# Patient Record
Sex: Female | Born: 1975 | Race: White | Hispanic: No | Marital: Single | Smoking: Former smoker
Health system: Southern US, Community
[De-identification: ages and names within clinical notes are randomized; demographics above are authoritative.]

## PROBLEM LIST (undated history)

## (undated) DIAGNOSIS — N83209 Unspecified ovarian cyst, unspecified side: Secondary | ICD-10-CM

## (undated) DIAGNOSIS — L709 Acne, unspecified: Secondary | ICD-10-CM

## (undated) DIAGNOSIS — K8020 Calculus of gallbladder without cholecystitis without obstruction: Secondary | ICD-10-CM

## (undated) DIAGNOSIS — K219 Gastro-esophageal reflux disease without esophagitis: Secondary | ICD-10-CM

## (undated) HISTORY — DX: Calculus of gallbladder without cholecystitis without obstruction: K80.20

## (undated) HISTORY — DX: Acne, unspecified: L70.9

## (undated) HISTORY — DX: Unspecified ovarian cyst, unspecified side: N83.209

## (undated) HISTORY — DX: Gastro-esophageal reflux disease without esophagitis: K21.9

---

## 1995-11-02 HISTORY — PX: APPENDECTOMY: SHX54

## 2002-04-24 ENCOUNTER — Other Ambulatory Visit: Admission: RE | Admit: 2002-04-24 | Discharge: 2002-04-24 | Payer: Self-pay | Admitting: Obstetrics & Gynecology

## 2003-04-03 ENCOUNTER — Emergency Department (HOSPITAL_COMMUNITY): Admission: EM | Admit: 2003-04-03 | Discharge: 2003-04-04 | Payer: Self-pay | Admitting: Emergency Medicine

## 2004-11-10 ENCOUNTER — Other Ambulatory Visit: Admission: RE | Admit: 2004-11-10 | Discharge: 2004-11-10 | Payer: Self-pay | Admitting: Gynecology

## 2006-02-28 ENCOUNTER — Other Ambulatory Visit: Admission: RE | Admit: 2006-02-28 | Discharge: 2006-02-28 | Payer: Self-pay | Admitting: Gynecology

## 2007-04-03 ENCOUNTER — Other Ambulatory Visit: Admission: RE | Admit: 2007-04-03 | Discharge: 2007-04-03 | Payer: Self-pay | Admitting: Gynecology

## 2008-11-01 HISTORY — PX: BREAST LUMPECTOMY: SHX2

## 2009-01-31 ENCOUNTER — Encounter: Admission: RE | Admit: 2009-01-31 | Discharge: 2009-01-31 | Payer: Self-pay | Admitting: Gynecology

## 2009-03-05 ENCOUNTER — Ambulatory Visit (HOSPITAL_BASED_OUTPATIENT_CLINIC_OR_DEPARTMENT_OTHER): Admission: RE | Admit: 2009-03-05 | Discharge: 2009-03-05 | Payer: Self-pay | Admitting: Surgery

## 2009-03-05 ENCOUNTER — Encounter (INDEPENDENT_AMBULATORY_CARE_PROVIDER_SITE_OTHER): Payer: Self-pay | Admitting: Surgery

## 2010-07-15 ENCOUNTER — Encounter: Admission: RE | Admit: 2010-07-15 | Discharge: 2010-07-15 | Payer: Self-pay | Admitting: Gynecology

## 2011-02-09 LAB — POCT HEMOGLOBIN-HEMACUE: Hemoglobin: 11.9 g/dL — ABNORMAL LOW (ref 12.0–15.0)

## 2011-03-16 NOTE — Op Note (Signed)
[name], Kristin Mccarthy            ACCOUNT NO.:  [id number]   MEDICAL RECORD NO.:  [id number]          PATIENT TYPE:  AMB   LOCATION:  [facility]SC                          FACILITY:  [facility]   PHYSICIAN:  Currie Paris, M.D.DATE OF BIRTH:  29-Jan-1976   DATE OF PROCEDURE:  03/05/2009  DATE OF DISCHARGE:                               OPERATIVE REPORT   PREOPERATIVE DIAGNOSIS:  Right breast mass, upper outer quadrant,  clinically fibrocystic but symptomatic.   POSTOPERATIVE DIAGNOSIS:  Right breast mass, upper outer quadrant,  clinically fibrocystic but symptomatic.   OPERATION:  Excision of right breast mass.   SURGEON:  Currie Paris, MD   ANESTHESIA:  General.   CLINICAL HISTORY:  This is a 35 year old lady with a tender nodular area  in the upper outer quadrant of the right breast and wished to have this  excised both for diagnosis and for pain relief.   DESCRIPTION OF PROCEDURE:  The patient was seen in the holding area and  had no further questions.  We marked the right side as the operative  side and she was taken to the operating room.  Prior to being given any  IV sedation, the exact area was marked with the patient's cooperation in  the upper outer quadrant.   The patient then underwent satisfactory general anesthesia.  The right  breast was prepped and draped.  A time-out was done.   I made a curvilinear incision over the area in question and divided some  of the subcutaneous tissues.  I put a Vicryl suture through the area in  question which I felt to have a somewhat demarcated edge superiorly.  Using retraction, I took a wide excisional biopsy of this area.  It  appeared to be all dense fibrotic breast tissue.  Once this area was  out, I carefully palpated the remaining breast to be sure there were no  other abnormalities and everything appeared to be okay.  I then put 20  mL of 0.25% plain Marcaine in to help with postop analgesia.  The  incision was closed in  layers with 3-0 Vicryl, 4-0 Monocryl subcuticular  plus Dermabond.   The patient tolerated the procedure well and there were no operative  complications.  All counts were correct.      Currie Paris, M.D.  Electronically Signed     CJS/MEDQ  D:  03/05/2009  T:  03/06/2009  Job:  [id number]   cc:   Gretta Cool, M.D.

## 2011-11-08 ENCOUNTER — Other Ambulatory Visit: Payer: Self-pay | Admitting: Gynecology

## 2012-11-16 ENCOUNTER — Ambulatory Visit: Payer: Self-pay | Admitting: Internal Medicine

## 2012-12-11 ENCOUNTER — Other Ambulatory Visit: Payer: Self-pay | Admitting: Family

## 2013-10-12 ENCOUNTER — Ambulatory Visit: Payer: Self-pay | Admitting: Adult Health

## 2013-10-18 ENCOUNTER — Ambulatory Visit: Payer: Self-pay | Admitting: Adult Health

## 2013-10-24 ENCOUNTER — Encounter: Payer: Self-pay | Admitting: Adult Health

## 2013-10-24 ENCOUNTER — Ambulatory Visit (INDEPENDENT_AMBULATORY_CARE_PROVIDER_SITE_OTHER): Payer: Managed Care, Other (non HMO) | Admitting: Adult Health

## 2013-10-24 VITALS — BP 126/80 | HR 105 | Resp 14 | Ht 62.0 in | Wt 187.0 lb

## 2013-10-24 DIAGNOSIS — K219 Gastro-esophageal reflux disease without esophagitis: Secondary | ICD-10-CM | POA: Insufficient documentation

## 2013-10-24 DIAGNOSIS — L659 Nonscarring hair loss, unspecified: Secondary | ICD-10-CM | POA: Insufficient documentation

## 2013-10-24 MED ORDER — ESOMEPRAZOLE MAGNESIUM 40 MG PO CPDR: 40.0000 mg | DELAYED_RELEASE_CAPSULE | Freq: Every day | ORAL | Status: DC

## 2013-10-24 NOTE — Patient Instructions (Signed)
  Below is the activation code for your MyChart Account  The office will call you with an appt for Endocrinology

## 2013-10-24 NOTE — Progress Notes (Signed)
Subjective:    Patient ID: Kristin Mccarthy, female    DOB: Jul 14, 1976, 37 y.o.   MRN: [id number]  HPI  Patient is a pleasant 37 year old female who presents to clinic to establish care. She was recently followed up by Dr. Randa Lynn at [place] [facility]. She is also followed by GYN at [facility]. Patient has been experiencing some hair thinning for approximately one year. She reports history of ovarian cysts. Patient had been started on birth control medication to help regulate this. She does report improvement of the ovarian cysts; however, hair thinning has worsened since August. She recently had labs drawn and it she has brought these results. CBC is normal. Thyroid function shows normal TSH, T4 is 15.2 and T3 is 20. Suspect that the alteration in her thyroid panel may be due in part by birth control medication. Patient also reports that she has increased phlegm production mainly in the morning. She reports history of GERD. Patient has been eating very late at night and then going to sleep.    Past Medical History  Diagnosis Date  . Ovarian cyst   . Gallstones   . GERD (gastroesophageal reflux disease)   . Acne     Followed by Vaughan Sine     Past Surgical History  Procedure Laterality Date  . Appendectomy  1997  . Breast lumpectomy Right 2010    Benign     Family History  Problem Relation Age of Onset  . Hypertension Mother   . Hyperlipidemia Father   . Hypertension Father   . Diabetes Father     Prediabetes  . Heart disease Father   . Heart disease Paternal Uncle   . Heart disease Paternal Grandmother   . Heart disease Paternal Grandfather      History   Social History  . Marital Status: Single    Spouse Name: N/A    Number of Children: 0  . Years of Education: 51   Occupational History  . IT     [employer]   Social History Main Topics  . Smoking status: Former Smoker -- 1.00 packs/day    Quit date: 11/24/2008  . Smokeless tobacco: Not on file  .  Alcohol Use: Yes  . Drug Use: Not on file  . Sexual Activity: Not on file   Other Topics Concern  . Not on file   Social History Narrative   [name] grew up in [place]. She attended [employer] and obtained her Associates degree. 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 She lives at home with her two dogs. She enjoys Financial risk analyst Public librarian). She does Primary school teacher.     Review of Systems  Constitutional: Negative.   HENT: Negative.   Eyes: Negative.   Respiratory: Negative.   Cardiovascular: Negative.   Gastrointestinal: Negative for nausea, diarrhea and blood in stool.       Increased symptoms of GERD  Endocrine: Negative.   Genitourinary: Negative.   Musculoskeletal: Negative.   Skin:       Thinning hair  Allergic/Immunologic: Negative.   Neurological: Negative.   Hematological: Negative.   Psychiatric/Behavioral: Negative.        Objective:   Physical Exam  Constitutional: She is oriented to person, place, and time.  Overweight, pleasant female in no apparent distress  HENT:  Head: Normocephalic and atraumatic.  Right Ear: External ear normal.  Left  Ear: External ear normal.  Eyes: Conjunctivae and EOM are normal. Pupils are equal, round, and reactive to light.  Neck: Normal range of motion. Neck supple. No thyromegaly present.  Cardiovascular: Normal rate, regular rhythm and normal heart sounds.   Pulmonary/Chest: Effort normal and breath sounds normal. No respiratory distress. She has no wheezes. She has no rales.  Abdominal: Soft. Bowel sounds are normal.  Musculoskeletal: Normal range of motion.  Lymphadenopathy:    She has no cervical adenopathy.  Neurological: She is alert and oriented to person, place, and time. She has normal reflexes.  Skin:  Thinning hair especially on the crown  Psychiatric: She has a  normal mood and affect. Her behavior is normal. Judgment and thought content normal.          Assessment & Plan:

## 2013-10-24 NOTE — Assessment & Plan Note (Signed)
Having increasing secretion mainly in the morning. Eats late at night and goes to bed afterward. Encouraged to watch diet especially eating late at night. Try short course of Nexium. Samples provided.

## 2013-10-24 NOTE — Assessment & Plan Note (Signed)
Worsening symptoms since August. First started ~ 1 year ago. Also has hx of ovarian cyst. She has been taking birth control medication to help control symptoms. TSH normal, T4 elevated, T3 low - ?bc causing fluctuation in levels. Discussed referral to endocrinology. Pt agreeable.

## 2013-10-29 ENCOUNTER — Encounter: Payer: Self-pay | Admitting: Emergency Medicine

## 2013-11-28 ENCOUNTER — Telehealth: Payer: Self-pay | Admitting: *Deleted

## 2013-11-28 NOTE — Telephone Encounter (Signed)
Pt called, her endocrinologist appointment has been pushed out until April. Continues to have issues with hair thinning, acne. Had recent labs ordered by psychiatrist, thyroid testing was normal, Vitamin D was low, but nothing to explain her symptoms. Pt will have labs sent to office for Raquel to review. She does not feel comfortable waiting until April to see Dr. Elvera LennoxGherghe, wants to know what she should do next. Offered appt, wants Raquel's recommendation after labs are received.

## 2013-11-28 NOTE — Telephone Encounter (Signed)
Labs to Raquel to review

## 2013-11-29 ENCOUNTER — Telehealth: Payer: Self-pay | Admitting: Adult Health

## 2013-11-29 NOTE — Telephone Encounter (Signed)
Pt left VM, checking if labs were received and anxious about what she need to do next. Called back and left patient message that labs were received, Raquel will review them, but that she may call and schedule an appt with her to discuss her ongoing symptoms and what needs to be done next.

## 2013-11-29 NOTE — Telephone Encounter (Signed)
Respond to pt MyChart message

## 2013-11-30 ENCOUNTER — Telehealth: Payer: Self-pay | Admitting: Family Medicine

## 2013-11-30 NOTE — Telephone Encounter (Signed)
Called pt and was able to get pt into office in [place] on [address], Feb 5th at 4:15. Be advised.

## 2013-11-30 NOTE — Telephone Encounter (Signed)
Pt left vm saying she had appt with Dr. Elvera LennoxGherghe that she had to wait 2 months to get and it has now been rescheduled for 6 weeks out.  She is losing her hair and is anxious to be seen sooner or by another Endocrinologist.  See telephone note from 11/28/13.

## 2013-12-02 ENCOUNTER — Other Ambulatory Visit: Payer: Self-pay | Admitting: Adult Health

## 2013-12-02 MED ORDER — VITAMIN B-12 1000 MCG PO TABS: 1000.0000 ug | ORAL_TABLET | Freq: Every day | ORAL | Status: DC

## 2013-12-02 MED ORDER — ERGOCALCIFEROL 1.25 MG (50000 UT) PO CAPS: 50000.0000 [IU] | ORAL_CAPSULE | ORAL | Status: DC

## 2013-12-03 ENCOUNTER — Ambulatory Visit: Payer: Managed Care, Other (non HMO) | Admitting: Adult Health

## 2013-12-05 NOTE — Telephone Encounter (Signed)
Pt called back, has appt with Dr. Elvera LennoxGherghe tomorrow.

## 2013-12-06 ENCOUNTER — Encounter: Payer: Self-pay | Admitting: Internal Medicine

## 2013-12-06 ENCOUNTER — Other Ambulatory Visit: Payer: Managed Care, Other (non HMO) | Admitting: *Deleted

## 2013-12-06 ENCOUNTER — Ambulatory Visit (INDEPENDENT_AMBULATORY_CARE_PROVIDER_SITE_OTHER): Payer: Managed Care, Other (non HMO) | Admitting: Internal Medicine

## 2013-12-06 VITALS — BP 126/78 | HR 99 | Temp 98.6°F | Resp 12 | Ht 62.0 in | Wt 196.0 lb

## 2013-12-06 DIAGNOSIS — E538 Deficiency of other specified B group vitamins: Secondary | ICD-10-CM

## 2013-12-06 DIAGNOSIS — L659 Nonscarring hair loss, unspecified: Secondary | ICD-10-CM | POA: Insufficient documentation

## 2013-12-06 DIAGNOSIS — R946 Abnormal results of thyroid function studies: Secondary | ICD-10-CM

## 2013-12-06 MED ORDER — CYANOCOBALAMIN 1000 MCG/ML IJ SOLN
1000.0000 ug | Freq: Once | INTRAMUSCULAR | Status: AC
  Administered 2013-12-06: 1000 ug via INTRAMUSCULAR

## 2013-12-06 NOTE — Progress Notes (Signed)
Patient ID: Kristin Mccarthy, female   DOB: 08/01/1976, 38 y.o.   MRN: [id number]   HPI  Kristin Mccarthy is a 38 y.o.-year-old female, referred by her PCP, Rey,Raquel, NP, at pt's request, for evaluation for hypothyroidism and hair loss.  I reviewed pt's thyroid tests: TSH 2.010, with slightly high T4 and slightly low T3 uptake. Previous labs have also been normal.  She is concerned for hair loss. She started to have hair falling last summer >> worsened 07/2013. She saw dermatology in 09/2013  (also had acne at the same time, which resolved with treatment) >> started Rogaine (5% Minoxidil) - 2x a day >> then 1x a day >> then stopped >> now back on it.  She has been on OCPs for a long time. She gets severe sxs from ovarian cysts if she does not take them.   Labs from 2 weeks ago: Low vitamin D 14 >> started 50,000 IU weekly, and will continue with over-the-counter vitamin D afterwards Low vitamin B12 232 >> started 1000 mcg. She was recommended to start the injections, but she refused at that time because she thought that she might need to miss work to get them.  ROS: Constitutional: + weight gain, no fatigue, no subjective hyperthermia/hypothermia Eyes: no blurry vision, no xerophthalmia ENT: no sore throat, no nodules palpated in throat, no dysphagia/odynophagia, no hoarseness Cardiovascular: no CP/SOB/palpitations/leg swelling Respiratory: no cough/SOB Gastrointestinal: no N/V/D/C, + heartburn Musculoskeletal: no muscle/joint aches Skin: no rashes - also: see HPI Neurological: no tremors/numbness/tingling/dizziness Psychiatric: + both: depression/anxiety  Past Medical History  Diagnosis Date  . Ovarian cyst   . Gallstones   . GERD (gastroesophageal reflux disease)   . Acne     Followed by Vaughan Sineerm   Past Surgical History  Procedure Laterality Date  . Appendectomy  1997  . Breast lumpectomy Right 2010    Benign   History   Social History  . Marital Status: Single    Spouse  Name: N/A    Number of Children: 0  . Years of Education: 6718   Occupational History  . IT Business analyst     [employer]   Social History Main Topics  . Smoking status: Former Smoker -- 1.00 packs/day    Quit date: 11/24/2008  . Smokeless tobacco: Not on file  . Alcohol Use: Yes - wine/liquor, 3-4 drinks once a week  . Drug Use: No   Social History Narrative   Kristin Mccarthy grew up in [place]. She attended [employer] and obtained her Associates degree. Then she attended [employer] in [place] and obtained her [employer]achel[employer] Systems then obtained a Child psychotherapistMaster in Production assistant, radionformation Systems Management from [employer]. She lives at home with her two dogs. She enjoys Financial risk analysttechnology Public librarian(tech junky). She does Primary school teachergraphic design.    Current Outpatient Prescriptions on File Prior to Visit  Medication Sig Dispense Refill  . ALPRAZolam (XANAX) 0.25 MG tablet Take 0.25 mg by mouth at bedtime as needed for anxiety.      . cetirizine (ZYRTEC) 1 MG/ML syrup Take by mouth daily.      . Dapsone (ACZONE) 5 % topical gel Apply topically 2 (two) times daily.      Marland Kitchen. dextroamphetamine (DEXEDRINE SPANSULE) 15 MG 24 hr capsule       . dextroamphetamine (DEXTROSTAT) 5 MG tablet Take 5 mg by mouth daily. Take on weekends when not take the ER tablets.      . ergocalciferol (DRISDOL) 50000 UNITS capsule Take 1  capsule (50,000 Units total) by mouth once a week. Take once weekly for 12 weeks  4 capsule  2  . esomeprazole (NEXIUM) 40 MG capsule Take 1 capsule (40 mg total) by mouth daily.  30 capsule  3  . lamoTRIgine (LAMICTAL) 150 MG tablet Take 200 mg by mouth daily.       Marland Kitchen MELATONIN ER PO Take by mouth.      . Minocycline HCl 80 MG TB24 Take by mouth.      . traZODone (DESYREL) 150 MG tablet Take by mouth at bedtime.      . vitamin B-12 (CYANOCOBALAMIN) 1000 MCG tablet Take 1 tablet (1,000 mcg total) by mouth daily.  30 tablet  0   No current facility-administered medications on file  prior to visit.   No Known Allergies Family History  Problem Relation Age of Onset  . Hypertension Mother   . Hyperlipidemia Father   . Hypertension Father   . Diabetes Father     Prediabetes  . Heart disease Father   . Heart disease Paternal Uncle   . Heart disease Paternal Grandmother   . Heart disease Paternal Grandfather    PE: BP 126/78  Pulse 99  Temp(Src) 98.6 F (37 C) (Oral)  Resp 12  Ht 5\' 2"  (1.575 m)  Wt 196 lb (88.905 kg)  BMI 35.84 kg/m2  SpO2 97% Wt Readings from Last 3 Encounters:  12/06/13 196 lb (88.905 kg)  10/24/13 187 lb (84.823 kg)   Constitutional: overweight, in NAD Eyes: PERRLA, EOMI, no exophthalmos ENT: moist mucous membranes, no thyromegaly, no cervical lymphadenopathy Cardiovascular: RRR, No MRG Respiratory: CTA B Gastrointestinal: abdomen soft, NT, ND, BS+ Musculoskeletal: no deformities, strength intact in all 4 Skin: moist, warm, no rashes; hair is thin, no alopecia areata; no scalp inflammation Neurological: no tremor with outstretched hands, DTR normal in all 4  ASSESSMENT: 1. ?Hypothyroidism  2. Hair thinning/loss  3. B12 deficiency  PLAN:  1. Patient with recent thyroid tests that are normal. She appears euthyroid. I reassured the patient that she is not hypothyroid  2. Hair thinning/loss We discussed about possible causes of alopecia:  Hypothyroidism - normal recent TFTs  Pregnancy - she is on OCPs  Menopause - unlikely   Poor diet - we discussed about the benefits of a plant-based diet  Stress - advised to try to relax more  Vitamin deficiencies - takes B12 vitamin (will give im inj today - will get the rest from PCP) and advised to start B6 vitamin supplements; is not taking excess vitamin A   Anemia - recent CBC normal  Medications - not on obvious meds that can cause hair loss - she does have PCOS, which can contribute to hair thinning and loss, but she is on oral contraceptives and the hair loss started  rather abruptly - I believe that her alopecia will improve with improving her diet and her vitamin D status - Advised to try to use scalp concealer in the meantime  3. B12 deficiency - she was recommended by PCP get IM vitamin B12 then she refused because she thought that this is given several times a week. I advised her that this is usually about once a month, and gave her the injection today (per her preference (and she will have the rest through PCP).

## 2013-12-06 NOTE — Patient Instructions (Signed)
Please continue to get B12 injections - can get them here or in Raquel Rey's office. Can use scalp concealer. Start a B complex (for the B6 vitamin content).  Your thyroid tests are normal.  Please return to our office as needed.

## 2013-12-19 ENCOUNTER — Ambulatory Visit: Payer: Managed Care, Other (non HMO) | Admitting: Internal Medicine

## 2014-01-08 ENCOUNTER — Ambulatory Visit (INDEPENDENT_AMBULATORY_CARE_PROVIDER_SITE_OTHER): Payer: Managed Care, Other (non HMO) | Admitting: *Deleted

## 2014-01-08 DIAGNOSIS — E538 Deficiency of other specified B group vitamins: Secondary | ICD-10-CM

## 2014-01-08 MED ORDER — CYANOCOBALAMIN 1000 MCG/ML IJ SOLN
1000.0000 ug | Freq: Once | INTRAMUSCULAR | Status: AC
  Administered 2014-01-08: 1000 ug via INTRAMUSCULAR

## 2014-01-24 IMAGING — US TRANSABDOMINAL ULTRASOUND OF PELVIS
1 series · 14 of 25 positions shown · non-contrast
Comparison: none

REASON FOR EXAM: severe abd pain and pelvic pain  elevated white blood
count
COMMENTS:

PROCEDURE:     US  - US PELVIS EXAM  - November 16, 2012 [DATE]
RESULT:     Comparison: None.
TECHNIQUE: Multiple grayscale and color Doppler images were obtained of the
pelvis via transabdominal ultrasound. Endovaginal ultrasound was not
performed as the patient refused.

[Series 1: transabdominal ultrasound of pelvis · 0.28mm/px · 14 of 48 slices shown]
[im 1/48]
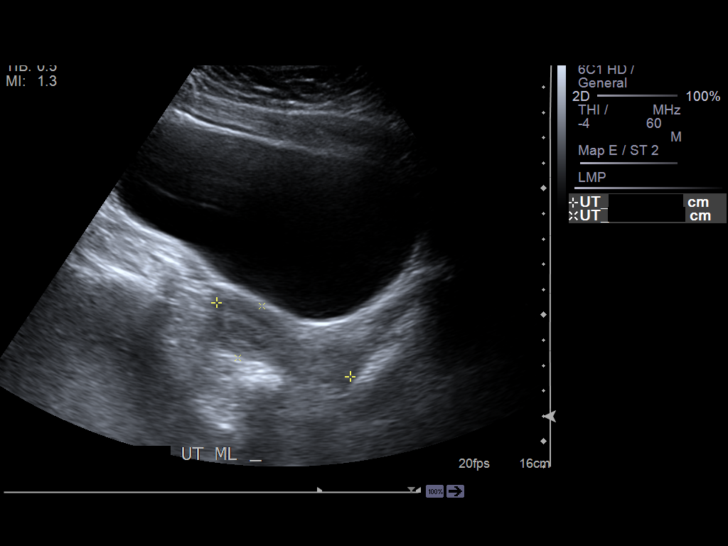
[im 4/48]
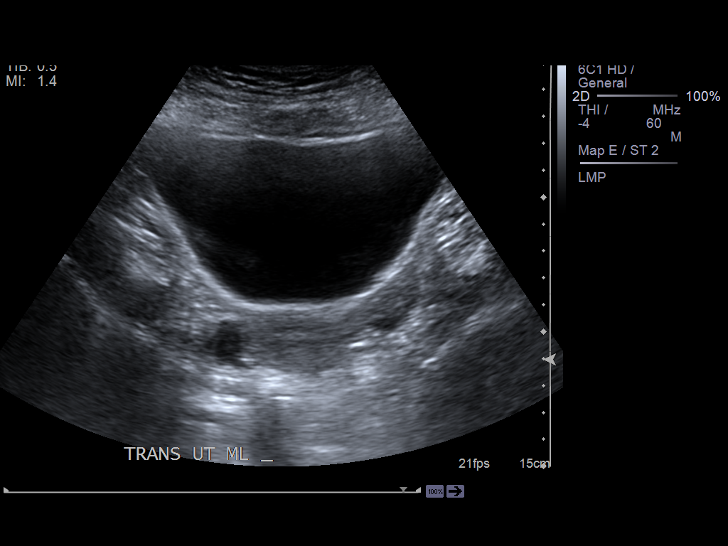
[im 8/48]
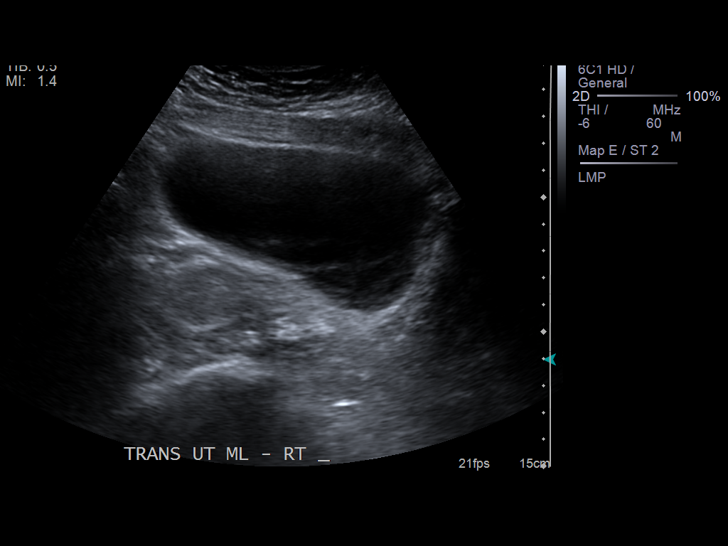
[im 12/48]
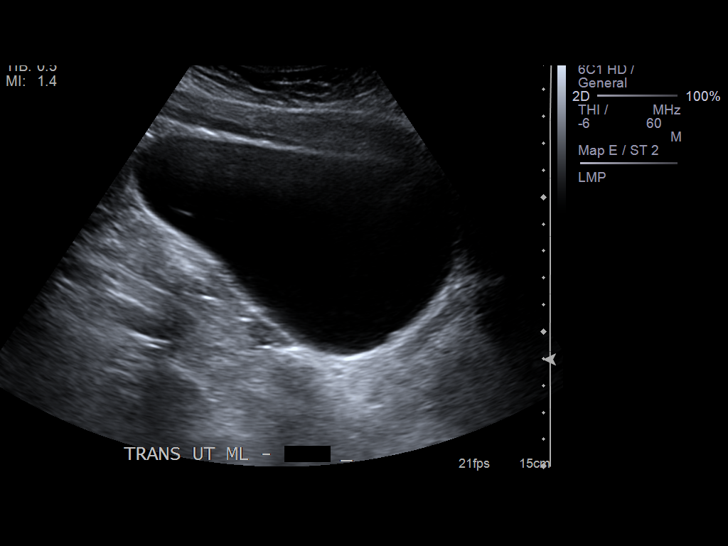
[im 16/48]
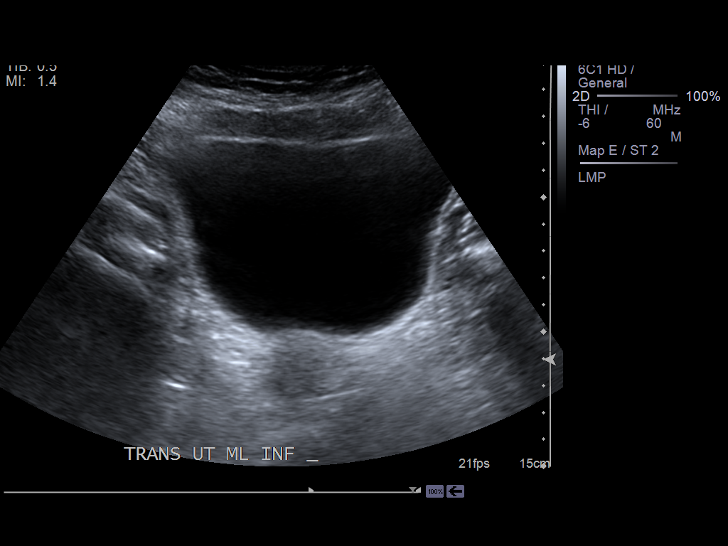
[im 18/48]
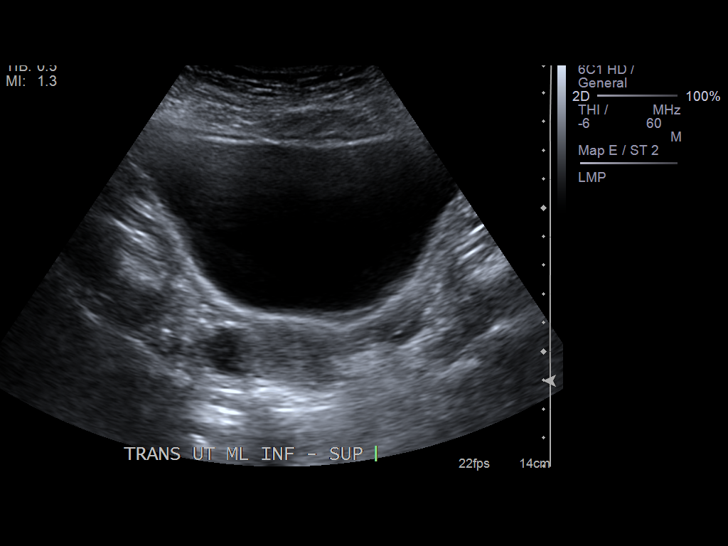
[im 22/48]
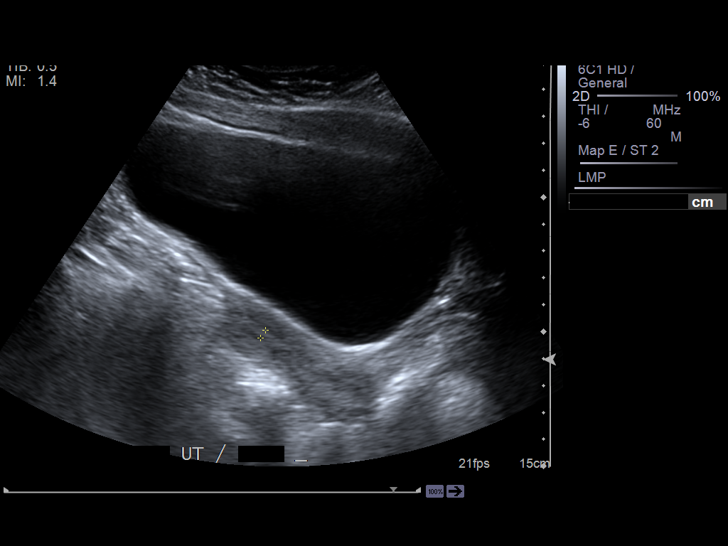
[im 26/48]
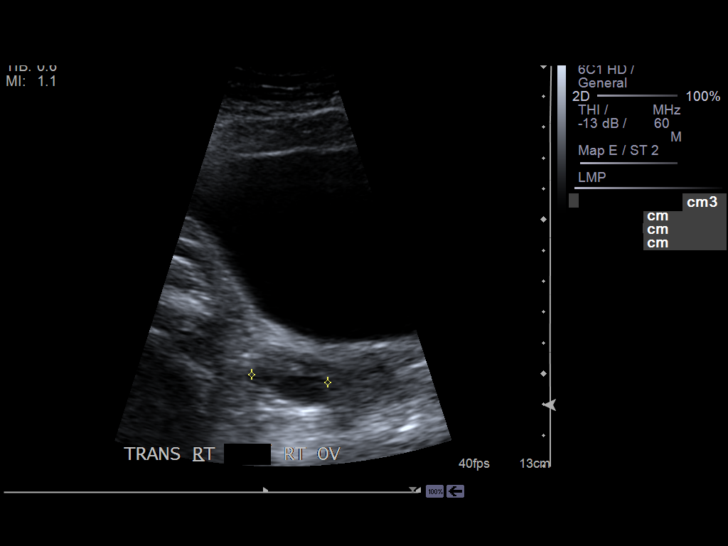
[im 30/48]
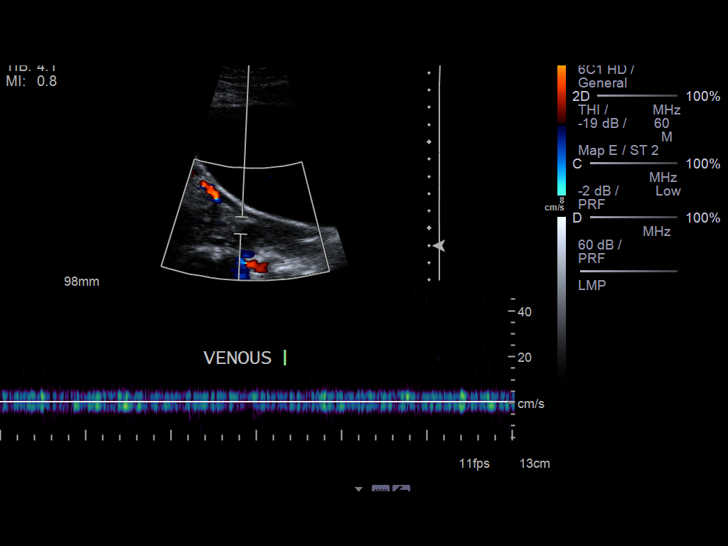
[im 32/48]
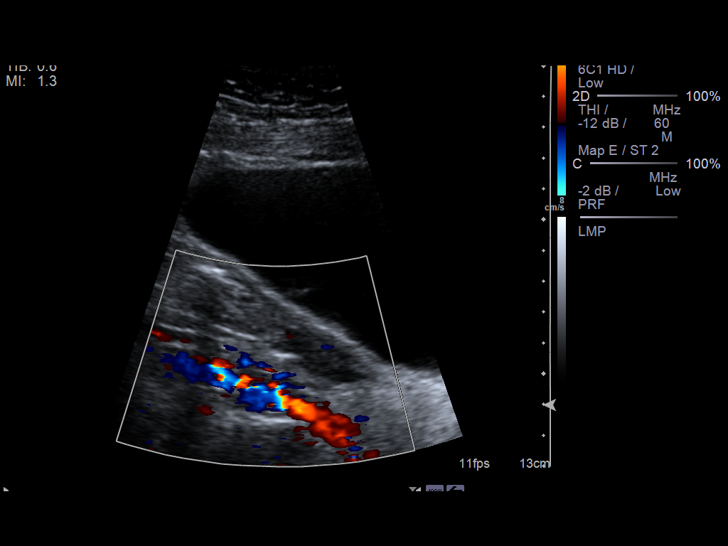
[im 36/48]
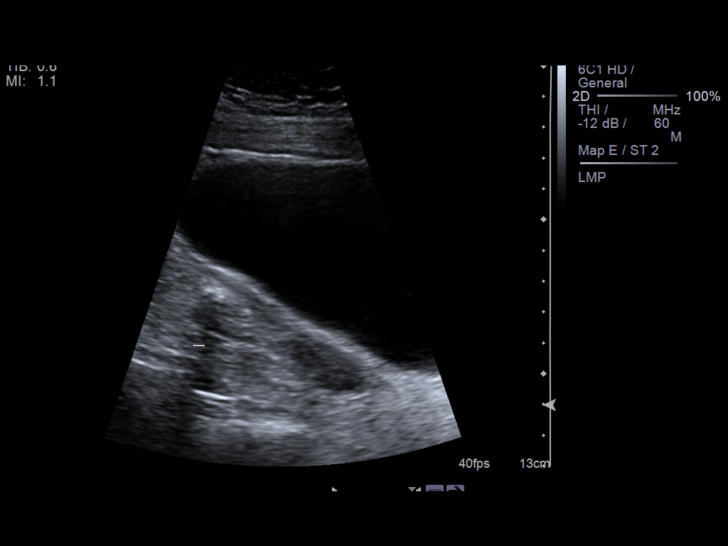
[im 40/48]
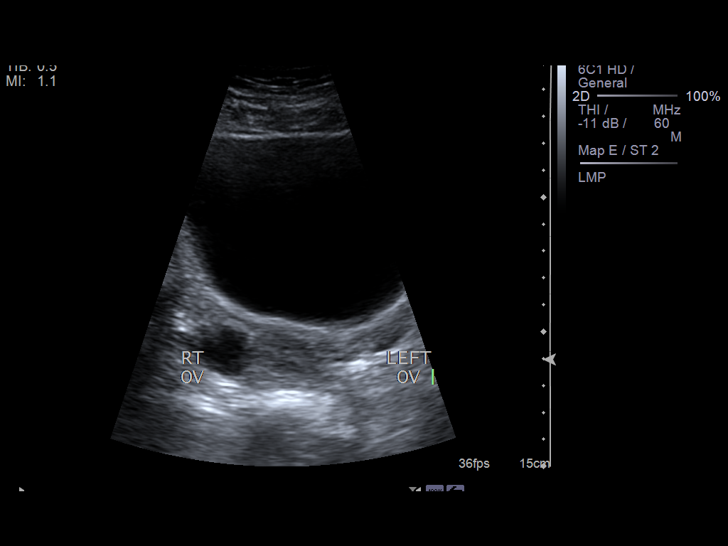
[im 44/48]
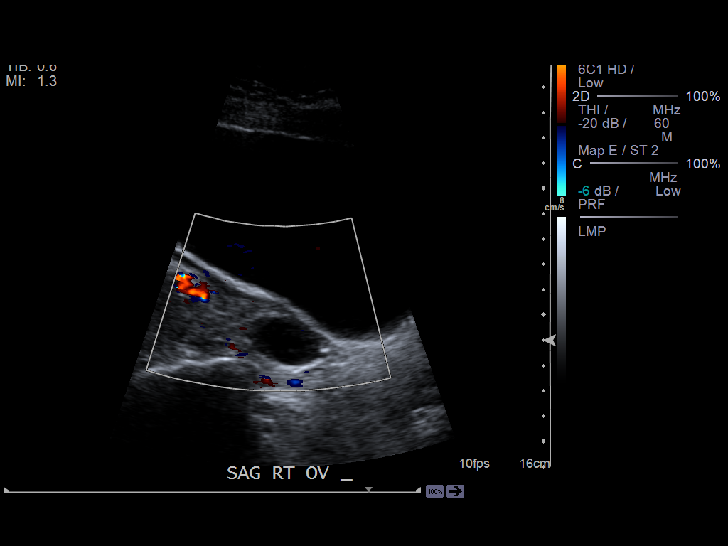
[im 48/48]
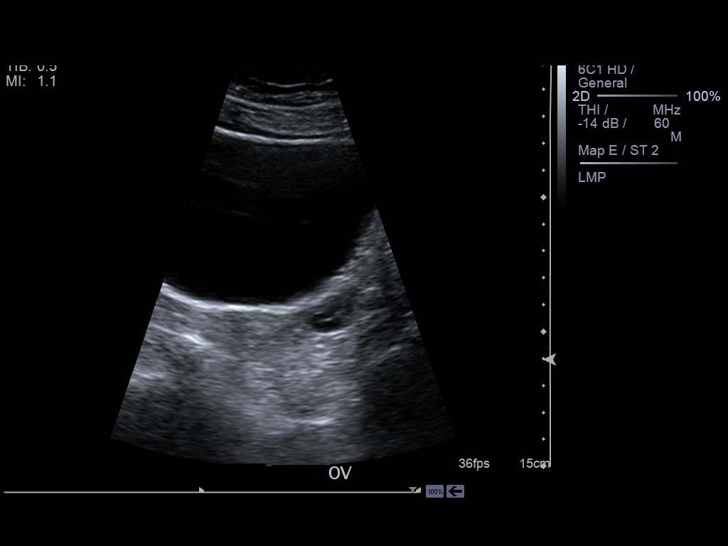

[14 of 25 positions shown; findings below may reference images not displayed]

FINDINGS: The uterus measures 6.0 x 2.3 x 3.7 cm. The endometrial stripe measures 3 mm
and thickness.

The right ovary measures 4.2 x 1.9 x 2.5 cm. The left ovary measures 2.7 x
1.3 x 2.2 cm. There is a cyst in the right ovary which measures 2.1 x 1.4 x
1.2 cm. Arterial and venous Spector Doppler waveforms are demonstrated in
the bilateral ovaries.
IMPRESSION: No acute findings. There is a 2.1 cm cyst in the right ovary.

[REDACTED]

## 2014-01-24 IMAGING — US ABDOMEN ULTRASOUND
1 series · 14 of 25 positions shown · non-contrast
Comparison: none

REASON FOR EXAM: severe abd pain and pelvic pain  elevated white blood
count
COMMENTS:

PROCEDURE:     US  - US ABDOMEN GENERAL SURVEY  - November 16, 2012 [DATE]
RESULT:     Comparison: None.
TECHNIQUE: Multiple grayscale and color Doppler images were obtained of the
abdomen.

[Series 1: abdomen ultrasound · 0.31mm/px · 14 of 99 slices shown]
[im 1/99]
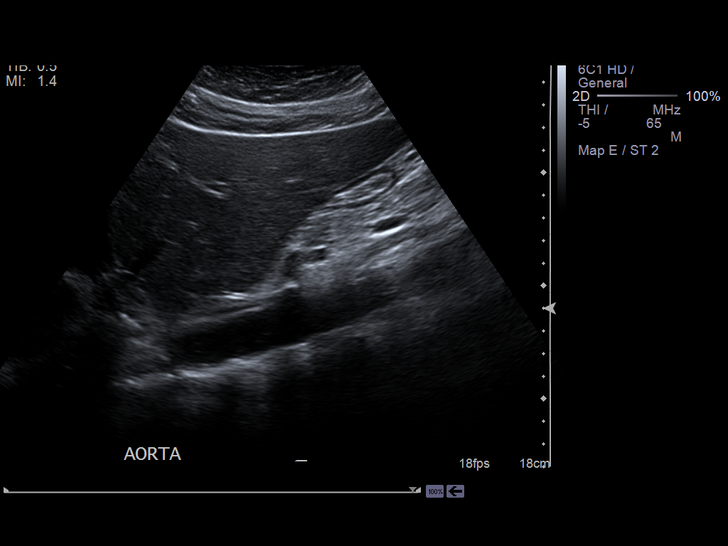
[im 9/99]
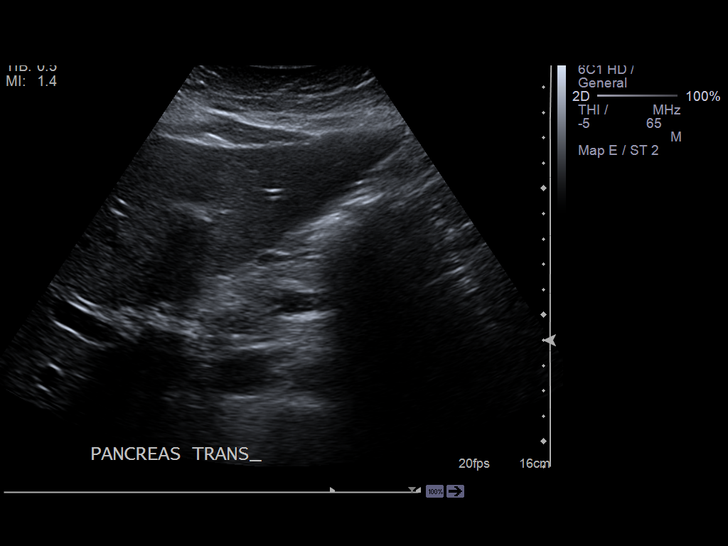
[im 17/99]
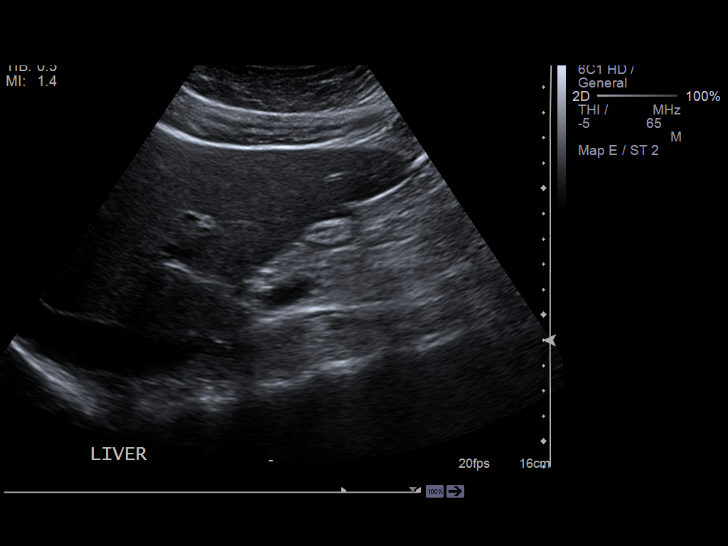
[im 25/99]
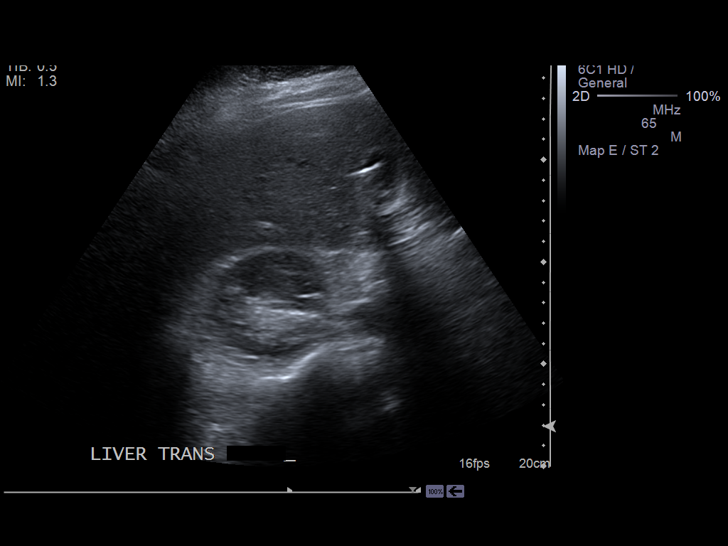
[im 33/99]
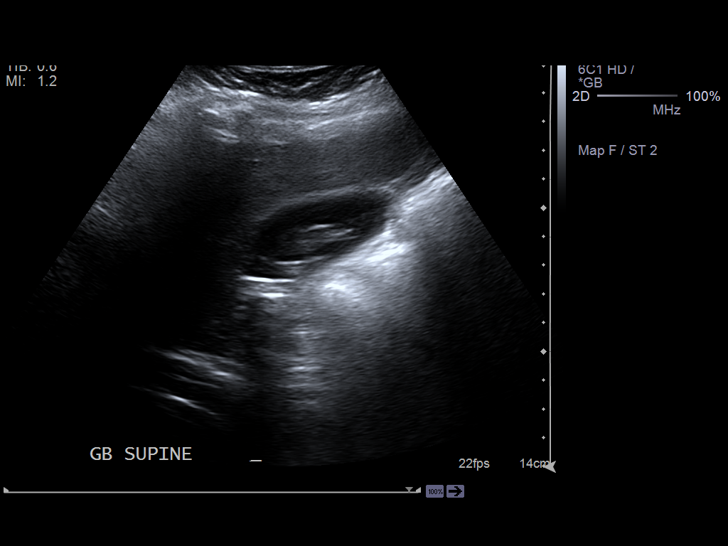
[im 37/99]
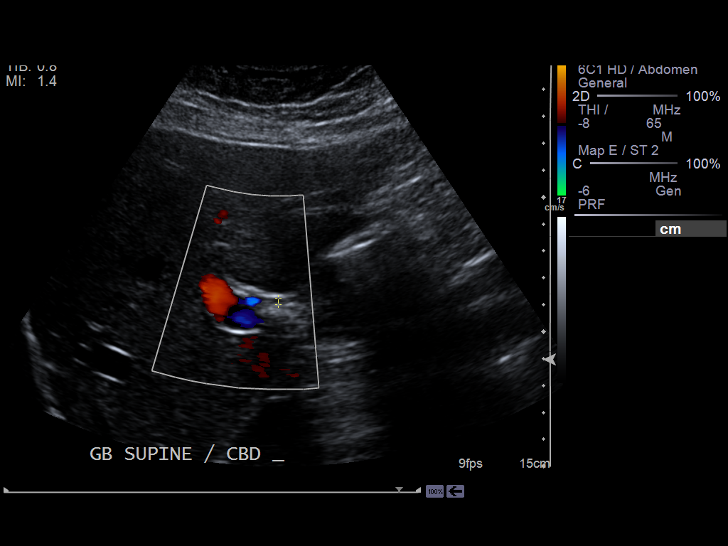
[im 45/99]
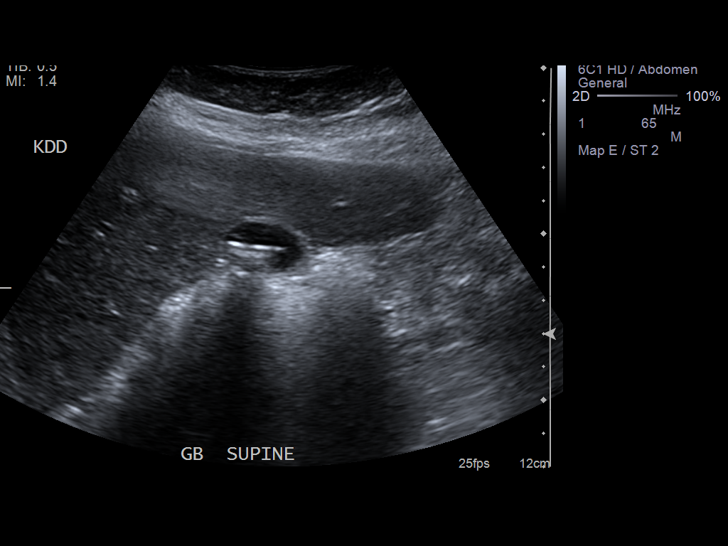
[im 54/99]
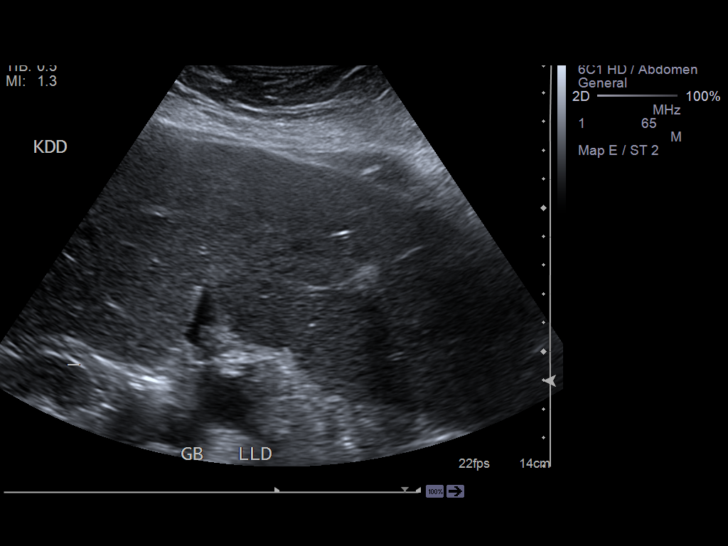
[im 62/99]
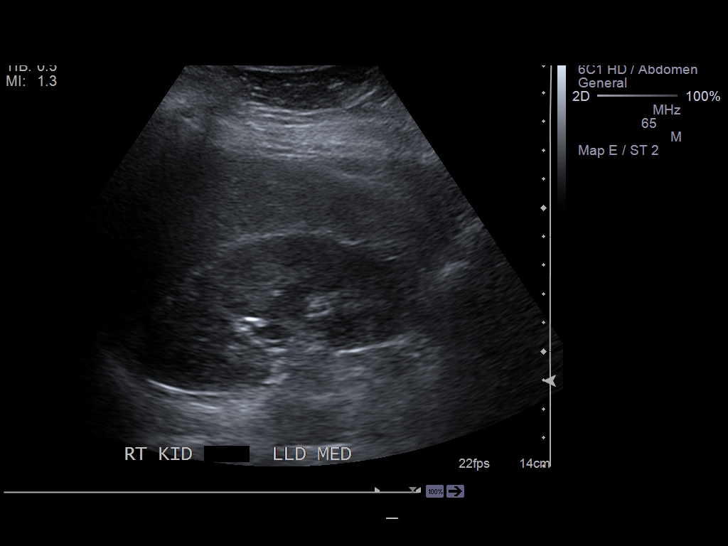
[im 66/99]
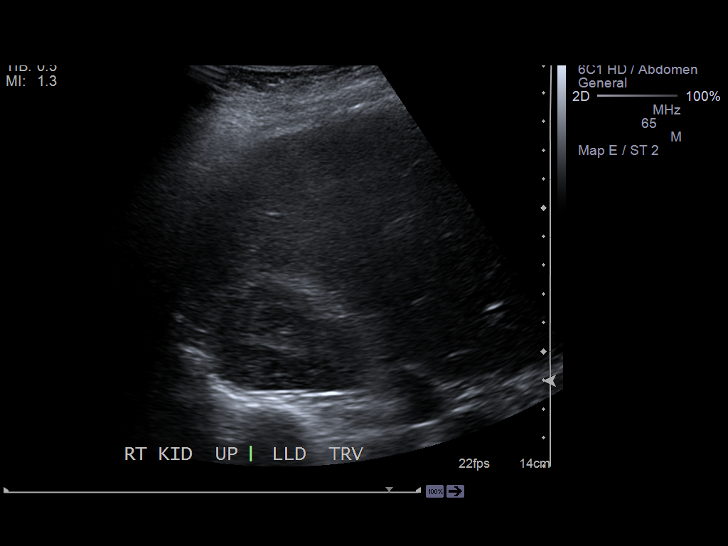
[im 74/99]
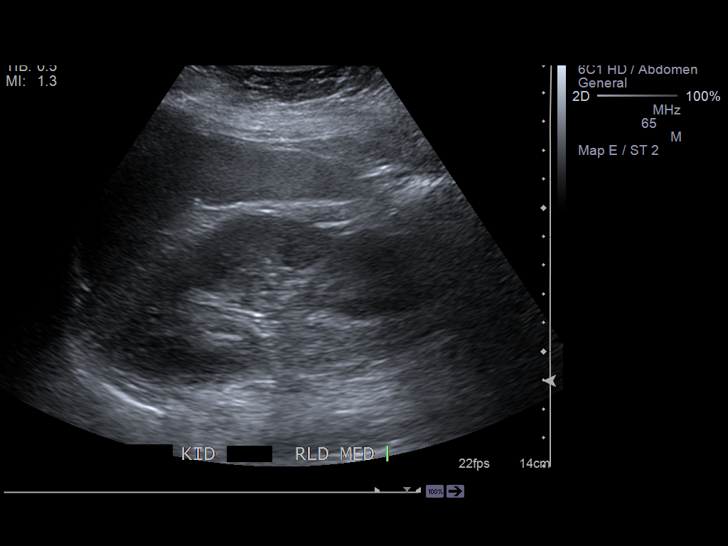
[im 82/99]
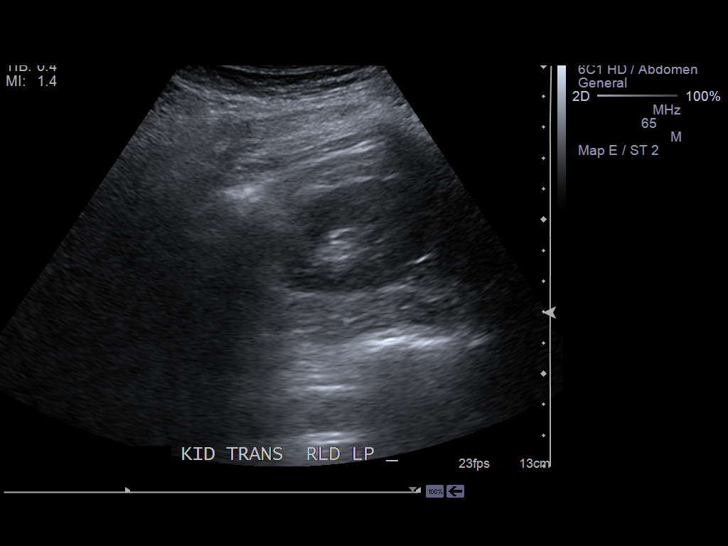
[im 90/99]
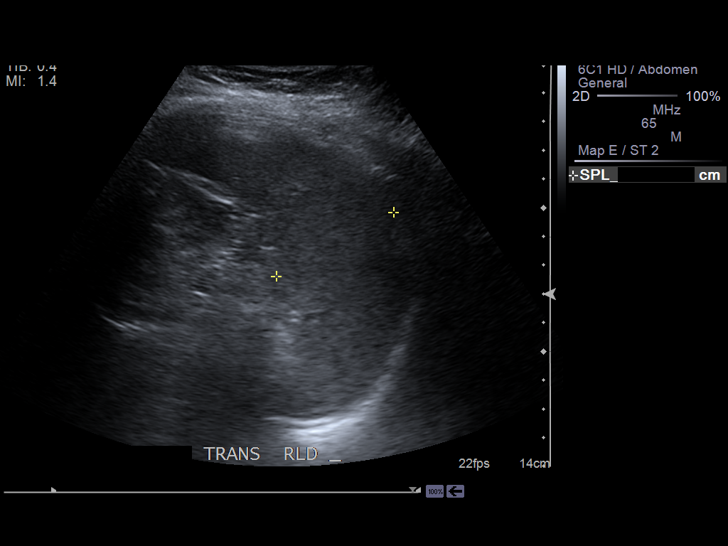
[im 99/99]
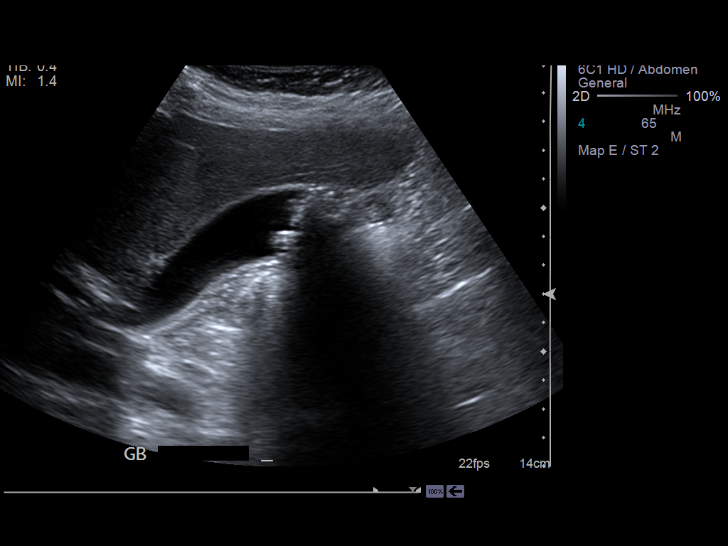

[14 of 25 positions shown; findings below may reference images not displayed]

FINDINGS: The pancreas was partially obscured by overlying bowel gas. The visualized
portion the pancreatic body is unremarkable. The visualized liver is
unremarkable. The main portal vein is patent. The common bile duct measures
2 mm in diameter. Multiple gallstones are seen within the gallbladder. No
gallbladder wall thickening or pericholecystic fluid. The sonographic Murphy
sign was negative.

Images of the kidneys showed no hydronephrosis. The visualized spleen is
unremarkable.
IMPRESSION: Cholelithiasis, without sonographic evidence of acute cholecystitis.

[REDACTED]

## 2014-02-06 ENCOUNTER — Ambulatory Visit: Payer: Managed Care, Other (non HMO) | Admitting: Internal Medicine

## 2014-02-08 ENCOUNTER — Encounter: Payer: Self-pay | Admitting: Adult Health

## 2014-02-15 ENCOUNTER — Encounter: Payer: Self-pay | Admitting: Adult Health

## 2014-02-15 ENCOUNTER — Ambulatory Visit (INDEPENDENT_AMBULATORY_CARE_PROVIDER_SITE_OTHER): Payer: Managed Care, Other (non HMO) | Admitting: Adult Health

## 2014-02-15 VITALS — BP 120/82 | HR 121 | Temp 98.4°F | Wt 205.1 lb

## 2014-02-15 DIAGNOSIS — L918 Other hypertrophic disorders of the skin: Secondary | ICD-10-CM | POA: Insufficient documentation

## 2014-02-15 DIAGNOSIS — L909 Atrophic disorder of skin, unspecified: Secondary | ICD-10-CM

## 2014-02-15 DIAGNOSIS — L919 Hypertrophic disorder of the skin, unspecified: Secondary | ICD-10-CM

## 2014-02-15 DIAGNOSIS — E538 Deficiency of other specified B group vitamins: Secondary | ICD-10-CM

## 2014-02-15 MED ORDER — ESOMEPRAZOLE MAGNESIUM 20 MG PO PACK: 20.0000 mg | PACK | Freq: Every day | ORAL | Status: DC

## 2014-02-15 MED ORDER — ALPRAZOLAM 0.25 MG PO TABS: 0.2500 mg | ORAL_TABLET | Freq: Two times a day (BID) | ORAL | Status: DC | PRN

## 2014-02-15 MED ORDER — CYANOCOBALAMIN 1000 MCG/ML IJ SOLN
1000.0000 ug | Freq: Once | INTRAMUSCULAR | Status: AC
  Administered 2014-02-15: 1000 ug via INTRAMUSCULAR

## 2014-02-15 NOTE — Progress Notes (Signed)
Pre visit review using our clinic review tool, if applicable. No additional management support is needed unless otherwise documented below in the visit note. 

## 2014-02-15 NOTE — Progress Notes (Signed)
   Subjective:    Patient ID: Kristin Mccarthy, female    DOB: May 22, 1976, 38 y.o.   MRN: [id number]  HPI  Pt is a pleasant 38 y/o female who presents to clinic to have two skin tags removed. They are on her neck and she is constantly picking at them.  Needs refill on her xanax.  Review of Systems  Skin:       Skin tags on neck  All other systems reviewed and are negative.      Objective:   Physical Exam  Constitutional: She is oriented to person, place, and time. No distress.  HENT:  Head: Normocephalic and atraumatic.  Eyes: Conjunctivae and EOM are normal.  Neck: Normal range of motion. Neck supple.  Cardiovascular: Normal rate and regular rhythm.   Pulmonary/Chest: Effort normal. No respiratory distress.  Musculoskeletal: Normal range of motion.  Neurological: She is alert and oriented to person, place, and time.  Skin: Skin is warm and dry.  Skin tags - small. Two that are side by side on the right posterior neck.  Psychiatric: She has a normal mood and affect. Her behavior is normal. Judgment and thought content normal.      Assessment & Plan:   1. Skin tag Removal of two skin tags on posterior right neck using histofreeze. Pt tolerated procedure well.

## 2014-02-15 NOTE — Addendum Note (Signed)
Addended by: Marchia MeiersEASTWOOD, Jaxie Racanelli M on: 02/15/2014 03:28 PM   Modules accepted: Orders

## 2014-02-26 ENCOUNTER — Encounter: Payer: Self-pay | Admitting: Adult Health

## 2014-03-05 ENCOUNTER — Other Ambulatory Visit: Payer: Self-pay | Admitting: Adult Health

## 2014-03-05 MED ORDER — ESOMEPRAZOLE MAGNESIUM 20 MG PO CPDR: 20.0000 mg | DELAYED_RELEASE_CAPSULE | ORAL | Status: DC

## 2014-04-01 ENCOUNTER — Telehealth: Payer: Self-pay | Admitting: *Deleted

## 2014-04-01 ENCOUNTER — Encounter: Payer: Self-pay | Admitting: Adult Health

## 2014-04-01 ENCOUNTER — Other Ambulatory Visit: Payer: Self-pay | Admitting: *Deleted

## 2014-04-01 MED ORDER — ESOMEPRAZOLE MAGNESIUM 20 MG PO CPDR: 20.0000 mg | DELAYED_RELEASE_CAPSULE | ORAL | Status: DC

## 2014-04-01 NOTE — Telephone Encounter (Signed)
Injection form was a mistake per Raquel. Pill form sent in to [facility] by [facility].

## 2014-04-01 NOTE — Telephone Encounter (Signed)
Refill Request  Esomeprazole 20 mg inj 

## 2014-04-01 NOTE — Telephone Encounter (Signed)
Refill Request  Esomeprazole 20 mg inj

## 2014-04-22 ENCOUNTER — Ambulatory Visit (INDEPENDENT_AMBULATORY_CARE_PROVIDER_SITE_OTHER): Payer: Managed Care, Other (non HMO) | Admitting: Adult Health

## 2014-04-22 ENCOUNTER — Encounter: Payer: Self-pay | Admitting: Adult Health

## 2014-04-22 VITALS — BP 114/82 | HR 97 | Temp 98.4°F | Resp 14 | Ht 62.0 in | Wt 207.8 lb

## 2014-04-22 DIAGNOSIS — L659 Nonscarring hair loss, unspecified: Secondary | ICD-10-CM

## 2014-04-22 DIAGNOSIS — L909 Atrophic disorder of skin, unspecified: Secondary | ICD-10-CM

## 2014-04-22 DIAGNOSIS — L918 Other hypertrophic disorders of the skin: Secondary | ICD-10-CM

## 2014-04-22 DIAGNOSIS — E538 Deficiency of other specified B group vitamins: Secondary | ICD-10-CM

## 2014-04-22 DIAGNOSIS — L919 Hypertrophic disorder of the skin, unspecified: Secondary | ICD-10-CM

## 2014-04-22 MED ORDER — CYANOCOBALAMIN 1000 MCG/ML IJ SOLN
1000.0000 ug | Freq: Once | INTRAMUSCULAR | Status: AC
  Administered 2014-04-22: 1000 ug via INTRAMUSCULAR

## 2014-04-22 NOTE — Progress Notes (Signed)
Pre visit review using our clinic review tool, if applicable. No additional management support is needed unless otherwise documented below in the visit note. 

## 2014-04-22 NOTE — Progress Notes (Signed)
Patient ID: Kristin CooperKimberly Mccarthy, female   DOB: May 12, 1976, 38 y.o.   MRN: [id number]   Subjective:    Patient ID: Kristin CooperKimberly Mccarthy, female    DOB: May 12, 1976, 38 y.o.   MRN: [id number]  HPI  Pt is a pleasant 38 year old female who presents to clinic with several concerns:  1. Skin tags on her neck. We have used Histofreeze on her in the past without complete resolution. She is here for repeat treatment.  2. Has missed 2 months of B12 injections. Notices a difference. She is here for her IM B12 injection.  3. Alopecia. Patient has seen dermatology and endocrine without etiology. She is concerned that her problem is ongoing and getting worse. Has spoken with her psychiatrist who, as a last resort, is willing to try to wean her off Lamictal to see if this may be the cause.   Past Medical History  Diagnosis Date  . Ovarian cyst   . Gallstones   . GERD (gastroesophageal reflux disease)   . Acne     Followed by Vaughan Sineerm    Current Outpatient Prescriptions on File Prior to Visit  Medication Sig Dispense Refill  . ALPRAZolam (XANAX) 0.25 MG tablet Take 1 tablet (0.25 mg total) by mouth 2 (two) times daily as needed for anxiety.  60 tablet  5  . cetirizine (ZYRTEC) 10 MG tablet Take 10 mg by mouth daily.      . Dapsone (ACZONE) 5 % topical gel Apply topically 2 (two) times daily.      Marland Kitchen. dextroamphetamine (DEXEDRINE SPANSULE) 15 MG 24 hr capsule       . dextroamphetamine (DEXTROSTAT) 5 MG tablet Take 5 mg by mouth daily. Take on weekends when not take the ER tablets.      . ergocalciferol (DRISDOL) 50000 UNITS capsule Take 1 capsule (50,000 Units total) by mouth once a week. Take once weekly for 12 weeks  4 capsule  2  . esomeprazole (NEXIUM) 20 MG capsule Take 1 capsule (20 mg total) by mouth every morning.  90 capsule  3  . lamoTRIgine (LAMICTAL) 100 MG tablet Take 100 mg by mouth daily.      Marland Kitchen. lamoTRIgine (LAMICTAL) 150 MG tablet Take 200 mg by mouth daily.       Devonne Doughty. LORYNA 3-0.02 MG tablet Take 1  tablet by mouth daily.       Marland Kitchen. MELATONIN ER PO Take by mouth.      . Minocycline HCl 80 MG TB24 Take 60 mg by mouth.       . Minoxidil (ROGAINE WOMENS) 5 % FOAM Apply topically.      Marland Kitchen. TAZORAC 0.1 % cream       . traZODone (DESYREL) 150 MG tablet Take by mouth at bedtime.      . vitamin B-12 (CYANOCOBALAMIN) 1000 MCG tablet Take 1 tablet (1,000 mcg total) by mouth daily.  30 tablet  0   No current facility-administered medications on file prior to visit.     Review of Systems  Constitutional: Positive for fatigue.  Skin:       Alopecia. Skin tags  All other systems reviewed and are negative.      Objective:  BP 114/82  Pulse 97  Temp(Src) 98.4 F (36.9 C) (Oral)  Resp 14  Ht 5\' 2"  (1.575 m)  Wt 207 lb 12 oz (94.235 kg)  BMI 37.99 kg/m2  SpO2 98%   Physical Exam  Constitutional: She is oriented to person, place, and time. No distress.  Cardiovascular:  Normal rate and regular rhythm.   Pulmonary/Chest: Effort normal. No respiratory distress.  Musculoskeletal: Normal range of motion.  Neurological: She is alert and oriented to person, place, and time.  Skin: Skin is warm and dry.  2 skin tags on right side of neck. Alopecia.  Psychiatric: She has a normal mood and affect. Her behavior is normal. Judgment and thought content normal.      Assessment & Plan:   1. B12 deficiency Received B12 1000 mcg IM today. - cyanocobalamin ((VITAMIN B-12)) injection 1,000 mcg; Inject 1 mL (1,000 mcg total) into the muscle once.  2. Hair loss She has seen derm and endocrine without finding etiology. Exploring if whether lamictal may be the cause. She is seeing her psychiatrist to have this weaned and see if symptoms improve.  3. Skin tag Histofreeze used on 2 skin tags on right side of neck. This is treatment #2. If no improvement will have derm remove.

## 2014-08-16 ENCOUNTER — Other Ambulatory Visit: Payer: Self-pay

## 2014-09-30 ENCOUNTER — Other Ambulatory Visit: Payer: Self-pay | Admitting: *Deleted

## 2014-09-30 MED ORDER — ALPRAZOLAM 0.25 MG PO TABS: 0.2500 mg | ORAL_TABLET | Freq: Two times a day (BID) | ORAL | Status: DC | PRN

## 2014-09-30 NOTE — Telephone Encounter (Signed)
Called to pharmacy, sent mychart on need for appt.

## 2014-09-30 NOTE — Telephone Encounter (Signed)
Approved for 1 month only # 60. Needs to make appointment to be seen for the next refill. Okay to call in. Thanks, CD.

## 2014-09-30 NOTE — Telephone Encounter (Signed)
Ok refill, last visit 04/22/14

## 2014-10-23 ENCOUNTER — Ambulatory Visit (INDEPENDENT_AMBULATORY_CARE_PROVIDER_SITE_OTHER): Payer: Managed Care, Other (non HMO) | Admitting: Nurse Practitioner

## 2014-10-23 ENCOUNTER — Encounter: Payer: Self-pay | Admitting: Nurse Practitioner

## 2014-10-23 VITALS — BP 118/65 | HR 82 | Temp 98.0°F | Resp 12 | Ht 62.0 in | Wt 187.8 lb

## 2014-10-23 DIAGNOSIS — E559 Vitamin D deficiency, unspecified: Secondary | ICD-10-CM

## 2014-10-23 DIAGNOSIS — K219 Gastro-esophageal reflux disease without esophagitis: Secondary | ICD-10-CM

## 2014-10-23 DIAGNOSIS — Z0000 Encounter for general adult medical examination without abnormal findings: Secondary | ICD-10-CM

## 2014-10-23 DIAGNOSIS — E538 Deficiency of other specified B group vitamins: Secondary | ICD-10-CM

## 2014-10-23 LAB — CBC WITH DIFFERENTIAL/PLATELET
BASOS PCT: 0.4 % (ref 0.0–3.0)
Basophils Absolute: 0 10*3/uL (ref 0.0–0.1)
EOS ABS: 0.3 10*3/uL (ref 0.0–0.7)
Eosinophils Relative: 2.2 % (ref 0.0–5.0)
HEMATOCRIT: 38.7 % (ref 36.0–46.0)
HEMOGLOBIN: 12.8 g/dL (ref 12.0–15.0)
Lymphocytes Relative: 36.2 % (ref 12.0–46.0)
Lymphs Abs: 4.2 10*3/uL — ABNORMAL HIGH (ref 0.7–4.0)
MCHC: 33.1 g/dL (ref 30.0–36.0)
MCV: 83.7 fl (ref 78.0–100.0)
MONO ABS: 0.5 10*3/uL (ref 0.1–1.0)
Monocytes Relative: 4.5 % (ref 3.0–12.0)
NEUTROS ABS: 6.5 10*3/uL (ref 1.4–7.7)
Neutrophils Relative %: 56.7 % (ref 43.0–77.0)
Platelets: 324 10*3/uL (ref 150.0–400.0)
RBC: 4.63 Mil/uL (ref 3.87–5.11)
RDW: 13.7 % (ref 11.5–15.5)
WBC: 11.5 10*3/uL — ABNORMAL HIGH (ref 4.0–10.5)

## 2014-10-23 MED ORDER — CYANOCOBALAMIN 1000 MCG/ML IJ SOLN
1000.0000 ug | Freq: Once | INTRAMUSCULAR | Status: AC
  Administered 2014-10-23: 1000 ug via INTRAMUSCULAR

## 2014-10-23 NOTE — Patient Instructions (Signed)
Please visit lab before leaving today.   We send the results to your MyChart.  See you in 1 month.

## 2014-10-23 NOTE — Progress Notes (Signed)
Pre visit review using our clinic review tool, if applicable. No additional management support is needed unless otherwise documented below in the visit note. 

## 2014-10-23 NOTE — Progress Notes (Signed)
Subjective:    Patient ID: Kristin Mccarthy, female    DOB: [birth year], 38 y.o.   MRN: [id number]  HPI  Kristin Mccarthy is a 38 yo female with a CC of med refills.   1) Last B12 shot was June 2015.   [facility] we know is covered by insurance for labs and [facility] is not.   Pt has: Dermatologist Psychiatrist- Dr. 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   Review of Systems  Constitutional: Negative for fever, chills, diaphoresis, fatigue and unexpected weight change.  Eyes: Negative for visual disturbance.  Respiratory: Negative for cough, chest tightness, shortness of breath and wheezing.   Cardiovascular: Negative for chest pain, palpitations and leg swelling.  Gastrointestinal: Negative for nausea, vomiting, abdominal pain, diarrhea and constipation.  Genitourinary: Negative for dysuria.  Musculoskeletal: Negative for myalgias.  Skin: Negative for rash.  Neurological: Positive for weakness and headaches. Negative for dizziness and numbness.  Psychiatric/Behavioral: The patient is hyperactive. The patient is not nervous/anxious.    Past Medical History  Diagnosis Date  . Ovarian cyst   . Gallstones   . GERD (gastroesophageal reflux disease)   . Acne     Followed by Vaughan Sineerm    History   Social History  . Marital Status: Single    Spouse Name: N/A    Number of Children: 0  . Years of Education: 5518   Occupational History  . IT     [employer]   Social History Main Topics  . Smoking status: Former Smoker -- 1.00 packs/day    Quit date: 11/24/2008  . Smokeless tobacco: Not on file  . Alcohol Use: Yes  . Drug Use: Not on file  . Sexual Activity: Not on file   Other Topics Concern  . Not on file   Social History Narrative   Kristin Mccarthy grew up in [place]. She attended [employer] and obtained her Associates degree. Then she attended [employer] in [place] and obtained her [employer]achelors  Information Systems then obtained a Child psychotherapistMaster in Production assistant, radionformation Systems Management from [employer]. She lives at home with her two dogs. She enjoys Financial risk analysttechnology Public librarian(tech junky). She does Primary school teachergraphic design.     Past Surgical History  Procedure Laterality Date  . Appendectomy  1997  . Breast lumpectomy Right 2010    Benign    Family History  Problem Relation Age of Onset  . Hypertension Mother   . Hyperlipidemia Father   . Hypertension Father   . Diabetes Father     Prediabetes  . Heart disease Father   . Heart disease Paternal Uncle   . Heart disease Paternal Grandmother   . Heart disease Paternal Grandfather     No Known Allergies  Current Outpatient Prescriptions on File Prior to Visit  Medication Sig Dispense Refill  . ALPRAZolam (XANAX) 0.25 MG tablet Take 1 tablet (0.25 mg total) by mouth 2 (two) times daily as needed for anxiety. 60 tablet 0  . cetirizine (ZYRTEC) 10 MG tablet Take 10 mg by mouth daily.    . Dapsone (ACZONE) 5 % topical gel Apply topically 2 (two) times daily.    Marland Kitchen. esomeprazole (NEXIUM) 20 MG capsule Take 1 capsule (20 mg total) by mouth every morning. 90 capsule 3  . LORYNA 3-0.02 MG tablet Take 1 tablet by mouth daily.     Marland Kitchen. MELATONIN ER PO Take by mouth.    Marland Kitchen. TAZORAC  0.1 % cream     . traZODone (DESYREL) 150 MG tablet Take by mouth at bedtime.    . vitamin B-12 (CYANOCOBALAMIN) 1000 MCG tablet Take 1 tablet (1,000 mcg total) by mouth daily. 30 tablet 0   No current facility-administered medications on file prior to visit.        Objective:   Physical Exam  Constitutional: She is oriented to person, place, and time. She appears well-developed and well-nourished. No distress.  HENT:  Head: Normocephalic and atraumatic.  Eyes: Conjunctivae and EOM are normal. Pupils are equal, round, and reactive to light. Right eye exhibits no discharge. Left eye exhibits no discharge. No scleral icterus.  Neck: Normal range of motion. Neck supple. No thyromegaly  present.  Cardiovascular: Normal rate and regular rhythm.   Pulmonary/Chest: Effort normal and breath sounds normal.  Abdominal: Soft. Bowel sounds are normal.  Musculoskeletal: Normal range of motion.  Lymphadenopathy:    She has no cervical adenopathy.  Neurological: She is alert and oriented to person, place, and time.  Skin: Skin is warm and dry. No rash noted. She is not diaphoretic.  Psychiatric: She has a normal mood and affect. Her behavior is normal. Judgment and thought content normal.    BP 118/65 mmHg  Pulse 82  Temp(Src) 98 F (36.7 C) (Oral)  Resp 12  Ht 5\' 2"  (1.575 m)  Wt 187 lb 12.8 oz (85.186 kg)  BMI 34.34 kg/m2  SpO2 96%      Assessment & Plan:

## 2014-10-24 DIAGNOSIS — E559 Vitamin D deficiency, unspecified: Secondary | ICD-10-CM | POA: Insufficient documentation

## 2014-10-24 LAB — VITAMIN D 25 HYDROXY (VIT D DEFICIENCY, FRACTURES): VITD: 27.57 ng/mL — ABNORMAL LOW (ref 30.00–100.00)

## 2014-10-24 LAB — VITAMIN B12: Vitamin B-12: 411 pg/mL (ref 211–911)

## 2014-10-24 NOTE — Assessment & Plan Note (Signed)
Fu in 1 month. Obtain B12 and gave shot today.

## 2014-10-24 NOTE — Assessment & Plan Note (Signed)
Stable on Nexium.

## 2014-10-24 NOTE — Assessment & Plan Note (Signed)
Obtain Vitamin D level. Continue taking OTC. Fu in 1 month

## 2014-10-28 ENCOUNTER — Encounter: Payer: Self-pay | Admitting: Nurse Practitioner

## 2014-11-18 ENCOUNTER — Telehealth: Payer: Self-pay | Admitting: Nurse Practitioner

## 2014-11-18 NOTE — Telephone Encounter (Signed)
Patient Name: Kristin Mccarthy  DOB: [birth year]    Initial Comment Caller states she has had a stiff neck for a couple, unsure if she did anything to hurt it.   Nurse Assessment  Nurse: Elijah Birkaldwell, RN, Lynda Date/Time (Eastern Time): 11/18/2014 10:44:03 AM  Confirm and document reason for call. If symptomatic, describe symptoms. ---Caller states she has had a stiff neck with some strain (like a pinched nerve) for a couple weeks, unsure if she did anything to hurt it. Thinks it may be a pillow issue. It is worse in the am. Headaches her & there.  Has the patient traveled out of the country within the last 30 days? ---Not Applicable  Does the patient require triage? ---Yes  Related visit to physician within the last 2 weeks? ---No  Does the PT have any chronic conditions? (i.e. diabetes, asthma, etc.) ---Yes  List chronic conditions. ---allergies, depression - on mood stabilizers  Did the patient indicate they were pregnant? ---No     Guidelines    Guideline Title Affirmed Question Affirmed Notes  Neck Pain or Stiffness [1] MODERATE neck pain (e.g., interferes with normal activities AND [2] present > 3 days    Final Disposition User   See PCP When Office is Open (within 3 days) Elijah Birkaldwell, Charity fundraiserN, [employer]ys[employer]Lynda

## 2014-11-20 ENCOUNTER — Ambulatory Visit (INDEPENDENT_AMBULATORY_CARE_PROVIDER_SITE_OTHER): Payer: Managed Care, Other (non HMO) | Admitting: Nurse Practitioner

## 2014-11-20 ENCOUNTER — Encounter: Payer: Self-pay | Admitting: Nurse Practitioner

## 2014-11-20 VITALS — BP 118/72 | HR 100 | Temp 98.2°F | Resp 12 | Ht 62.0 in | Wt 188.1 lb

## 2014-11-20 DIAGNOSIS — M6248 Contracture of muscle, other site: Secondary | ICD-10-CM

## 2014-11-20 DIAGNOSIS — M62838 Other muscle spasm: Secondary | ICD-10-CM

## 2014-11-20 MED ORDER — CYCLOBENZAPRINE HCL 5 MG PO TABS: 5.0000 mg | ORAL_TABLET | Freq: Every day | ORAL | Status: DC

## 2014-11-20 NOTE — Progress Notes (Signed)
Subjective:    Patient ID: Kristin Mccarthy, female    DOB: 1975-12-31, 39 y.o.   MRN: [id number]  HPI  Kristin Mccarthy is a 39 yo female with a CC of stiff neck x 7 weeks.   1) Started around the holidays, about 4 weeks of intermittent and then 3 weeks solid. It is located bilaterally. 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 Treatment to date:  Tried stretching at home  Trying different pillows Tried different positions  Not worsening  Ibuprofen- somewhat helpful   Review of Systems  Constitutional: Negative for fever, chills, diaphoresis and fatigue.  Respiratory: Negative for chest tightness, shortness of breath and wheezing.   Cardiovascular: Negative for chest pain, palpitations and leg swelling.  Gastrointestinal: Negative for nausea, vomiting, diarrhea and rectal pain.  Musculoskeletal: Positive for myalgias and neck stiffness. Negative for neck pain.       Muscles are tight she states  Skin: Negative for rash.  Neurological: Negative for dizziness, weakness, numbness and headaches.  Psychiatric/Behavioral: The patient is not nervous/anxious.    Past Medical History  Diagnosis Date  . Ovarian cyst   . Gallstones   . GERD (gastroesophageal reflux disease)   . Acne     Followed by Vaughan Sine    History   Social History  . Marital Status: Single    Spouse Name: N/A    Number of Children: 0  . Years of Education: 35   Occupational History  . IT     [employer]   Social History Main Topics  . Smoking status: Former Smoker -- 1.00 packs/day    Quit date: 11/24/2008  . Smokeless tobacco: Not on file  . Alcohol Use: Yes  . Drug Use: Not on file  . Sexual Activity: Not on file   Other Topics Concern  . Not on file   Social History Narrative   Kristin Mccarthy grew up in [place]. She attended [employer] and obtained her Associates degree. 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 She lives at home with her two dogs. She enjoys Financial risk analyst Public librarian). She does Primary school teacher.     Past Surgical History  Procedure Laterality Date  . Appendectomy  1997  . Breast lumpectomy Right 2010    Benign    Family History  Problem Relation Age of Onset  . Hypertension Mother   . Hyperlipidemia Father   . Hypertension Father   . Diabetes Father     Prediabetes  . Heart disease Father   . Heart disease Paternal Uncle   . Heart disease Paternal Grandmother   . Heart disease Paternal Grandfather     No Known Allergies  Current Outpatient Prescriptions on File Prior to Visit  Medication Sig Dispense Refill  . ALPRAZolam (XANAX) 0.25 MG tablet Take 1 tablet (0.25 mg total) by mouth 2 (two) times daily as needed for anxiety. 60 tablet 0  . cetirizine (ZYRTEC) 10 MG tablet Take 10 mg by mouth daily.    . Dapsone (ACZONE) 5 % topical gel Apply topically 2 (two) times daily.    Marland Kitchen esomeprazole (NEXIUM) 20 MG capsule Take 1 capsule (20 mg total) by mouth every morning. 90 capsule 3  . LORYNA 3-0.02 MG tablet Take 1 tablet by mouth daily.     Marland Kitchen MELATONIN ER PO Take by mouth.    Marland Kitchen  TAZORAC 0.1 % cream     . traZODone (DESYREL) 150 MG tablet Take by mouth at bedtime.    . vitamin B-12 (CYANOCOBALAMIN) 1000 MCG tablet Take 1 tablet (1,000 mcg total) by mouth daily. 30 tablet 0  . VYVANSE 40 MG capsule Take 40 mg by mouth every morning.  0  . lamoTRIgine (LAMICTAL) 25 MG tablet Take 25 mg by mouth daily.  0   No current facility-administered medications on file prior to visit.       Objective:   Physical Exam  Constitutional: She is oriented to person, place, and time. She appears well-developed and well-nourished. No distress.  BP 118/72 mmHg  Pulse 100  Temp(Src) 98.2 F (36.8 C) (Oral)  Resp 12  Ht 5\' 2"  (1.575 m)  Wt 188 lb 1.9 oz (85.331 kg)  BMI 34.40  kg/m2  SpO2 98%  LMP  (Approximate)   HENT:  Head: Normocephalic and atraumatic.  Right Ear: External ear normal.  Left Ear: External ear normal.  Eyes: Conjunctivae and EOM are normal. Pupils are equal, round, and reactive to light. Right eye exhibits no discharge. Left eye exhibits no discharge. No scleral icterus.  Neck: Normal range of motion. Neck supple. No thyromegaly present.  Pt is able to do full ROM. Tender to palpation of trapezius muscles bilaterally.   Cardiovascular: Normal rate and regular rhythm.   Pulmonary/Chest: Effort normal and breath sounds normal.  Lymphadenopathy:    She has no cervical adenopathy.  Neurological: She is alert and oriented to person, place, and time. No cranial nerve deficit. She exhibits normal muscle tone. Coordination normal.  Negative tests for meningitis.   Skin: Skin is warm and dry. No rash noted. She is not diaphoretic.  Psychiatric: She has a normal mood and affect. Her behavior is normal. Judgment and thought content normal.       Assessment & Plan:

## 2014-11-20 NOTE — Patient Instructions (Signed)
Tuesday 3:00 p.m. - 5:30 p.m. Wednesday 2:00 p.m. - 5:00 p.m. Thursday 9:00 am - 2:00 p.m.  Location RN [facility] [address], [place] [address] Phone [phone]

## 2014-11-20 NOTE — Progress Notes (Signed)
Pre visit review using our clinic review tool, if applicable. No additional management support is needed unless otherwise documented below in the visit note. 

## 2014-11-22 DIAGNOSIS — M62838 Other muscle spasm: Secondary | ICD-10-CM | POA: Insufficient documentation

## 2014-11-22 NOTE — Assessment & Plan Note (Signed)
Stable. Rx for medical [facility]. Also, rx for flexeril 5 mg at bedtime. FU prn.

## 2015-01-17 ENCOUNTER — Other Ambulatory Visit: Payer: Self-pay | Admitting: *Deleted

## 2015-01-17 ENCOUNTER — Telehealth: Payer: Self-pay | Admitting: *Deleted

## 2015-01-17 MED ORDER — ALPRAZOLAM 0.25 MG PO TABS: 0.2500 mg | ORAL_TABLET | Freq: Two times a day (BID) | ORAL | Status: DC | PRN

## 2015-01-17 NOTE — Telephone Encounter (Signed)
Okay to refill? 

## 2015-01-17 NOTE — Telephone Encounter (Signed)
Fax from pharmacy requesting Alprazolam 0.25 mg.  Last refill 11.30.15.  Last OV 1.20.16.  Please advise refill

## 2015-01-17 NOTE — Telephone Encounter (Signed)
rx faxed

## 2015-02-26 ENCOUNTER — Encounter: Payer: Self-pay | Admitting: Nurse Practitioner

## 2015-03-03 ENCOUNTER — Other Ambulatory Visit: Payer: Self-pay | Admitting: *Deleted

## 2015-03-03 MED ORDER — ESOMEPRAZOLE MAGNESIUM 20 MG PO CPDR: 20.0000 mg | DELAYED_RELEASE_CAPSULE | ORAL | Status: DC

## 2015-03-04 MED ORDER — ESOMEPRAZOLE MAGNESIUM 20 MG PO CPDR: 20.0000 mg | DELAYED_RELEASE_CAPSULE | ORAL | Status: DC

## 2015-03-04 NOTE — Telephone Encounter (Signed)
Rfill accidnetally sent to [facility], resent to [facility]

## 2015-03-04 NOTE — Addendum Note (Signed)
Addended by: Chandra BatchNIXON, Damareon Lanni E on: 03/04/2015 11:03 AM   Modules accepted: Orders

## 2015-08-17 ENCOUNTER — Other Ambulatory Visit: Payer: Self-pay | Admitting: Nurse Practitioner

## 2015-08-18 ENCOUNTER — Other Ambulatory Visit: Payer: Self-pay | Admitting: *Deleted

## 2015-08-18 MED ORDER — ESOMEPRAZOLE MAGNESIUM 20 MG PO CPDR: 20.0000 mg | DELAYED_RELEASE_CAPSULE | ORAL | Status: DC

## 2015-10-10 ENCOUNTER — Encounter: Payer: Self-pay | Admitting: Nurse Practitioner

## 2015-10-14 ENCOUNTER — Other Ambulatory Visit: Payer: Self-pay | Admitting: Nurse Practitioner

## 2015-10-14 MED ORDER — SERTRALINE HCL 50 MG PO TABS: 50.0000 mg | ORAL_TABLET | Freq: Every day | ORAL | Status: DC

## 2015-11-12 ENCOUNTER — Encounter: Payer: Self-pay | Admitting: Nurse Practitioner

## 2015-11-12 ENCOUNTER — Ambulatory Visit (INDEPENDENT_AMBULATORY_CARE_PROVIDER_SITE_OTHER): Payer: 59 | Admitting: Nurse Practitioner

## 2015-11-12 VITALS — BP 104/78 | HR 107 | Temp 97.9°F | Resp 16 | Ht 62.0 in | Wt 213.6 lb

## 2015-11-12 DIAGNOSIS — E669 Obesity, unspecified: Secondary | ICD-10-CM | POA: Diagnosis not present

## 2015-11-12 DIAGNOSIS — G4700 Insomnia, unspecified: Secondary | ICD-10-CM | POA: Diagnosis not present

## 2015-11-12 DIAGNOSIS — F909 Attention-deficit hyperactivity disorder, unspecified type: Secondary | ICD-10-CM | POA: Diagnosis not present

## 2015-11-12 MED ORDER — DEXTROAMPHETAMINE SULFATE ER 15 MG PO CP24: 15.0000 mg | ORAL_CAPSULE | Freq: Every morning | ORAL | Status: DC

## 2015-11-12 NOTE — Patient Instructions (Signed)
Follow up with me in 3 months.   MyChart me if anything changes

## 2015-11-12 NOTE — Progress Notes (Signed)
Patient ID: Kristin Mccarthy, female    DOB: [birth year]  Age: 40 y.o. MRN: [id number]  CC: medication consult   HPI Kristin Mccarthy presents for CC of taking over prescribing of medications.  1) Dr. Imogene Burn has left his practice in [facility]  Getting set up with a therapist  Sertraline 50 mg  Doxepin 10 mg/ ml oral concentrate  Dextroamphetamine Sulfate 25 mg ER   Ronnette Juniper behavioral is new therapist  Concerned about weight gain Eating often, binging, gets take out often  Was on Vyvanse in past  History Leanza has a past medical history of Ovarian cyst; Gallstones; GERD (gastroesophageal reflux disease); and Acne.   She has past surgical history that includes Appendectomy (1997) and Breast lumpectomy (Right, 2010).   Her family history includes Diabetes in her father; Heart disease in her father, paternal grandfather, paternal grandmother, and paternal uncle; Hyperlipidemia in her father; Hypertension in her father and mother.She reports that she quit smoking about 6 years ago. She does not have any smokeless tobacco history on file. She reports that she drinks alcohol. Her drug history is not on file.  Outpatient Prescriptions Prior to Visit  Medication Sig Dispense Refill  . ALPRAZolam (XANAX) 0.25 MG tablet Take 1 tablet (0.25 mg total) by mouth 2 (two) times daily as needed for anxiety. 60 tablet 0  . cetirizine (ZYRTEC) 10 MG tablet Take 10 mg by mouth daily.    . Dapsone (ACZONE) 5 % topical gel Apply topically 2 (two) times daily.    Marland Kitchen MELATONIN ER PO Take by mouth.    . sertraline (ZOLOFT) 50 MG tablet Take 1 tablet (50 mg total) by mouth daily. 90 tablet 0  . TAZORAC 0.1 % cream     . traZODone (DESYREL) 150 MG tablet Take by mouth at bedtime.    Marland Kitchen VYVANSE 40 MG capsule Take 40 mg by mouth every morning.  0  . cyclobenzaprine (FLEXERIL) 5 MG tablet Take 1 tablet (5 mg total) by mouth at bedtime. (Patient not taking: Reported on 11/12/2015) 30 tablet 0  .  esomeprazole (NEXIUM) 20 MG capsule Take 1 capsule (20 mg total) by mouth every morning. (Patient not taking: Reported on 11/12/2015) 90 capsule 1  . lamoTRIgine (LAMICTAL) 25 MG tablet Take 25 mg by mouth daily. Reported on 11/12/2015  0  . LORYNA 3-0.02 MG tablet Take 1 tablet by mouth daily.     Marland Kitchen topiramate (TOPAMAX) 100 MG tablet Take 150 mg by mouth daily.    . vitamin B-12 (CYANOCOBALAMIN) 1000 MCG tablet Take 1 tablet (1,000 mcg total) by mouth daily. (Patient not taking: Reported on 11/12/2015) 30 tablet 0   No facility-administered medications prior to visit.    ROS Review of Systems  Constitutional: Positive for appetite change. Negative for fever, chills, diaphoresis, activity change, fatigue and unexpected weight change.       Appetite increased  Respiratory: Negative for chest tightness, shortness of breath and wheezing.   Cardiovascular: Negative for chest pain, palpitations and leg swelling.  Gastrointestinal: Negative for nausea, vomiting and diarrhea.  Skin: Negative for rash.  Neurological: Negative for dizziness, weakness, numbness and headaches.  Psychiatric/Behavioral: Positive for sleep disturbance and decreased concentration. Negative for suicidal ideas. The patient is nervous/anxious.     Objective:  BP 104/78 mmHg  Pulse 107  Temp(Src) 97.9 F (36.6 C) (Oral)  Resp 16  Ht 5\' 2"  (1.575 m)  Wt 213 lb 9.6 oz (96.888 kg)  BMI 39.06 kg/m2  SpO2 97%  LMP 08/12/2015   Physical Exam  Constitutional: She is oriented to person, place, and time. She appears well-developed and well-nourished. No distress.  HENT:  Head: Normocephalic and atraumatic.  Right Ear: External ear normal.  Left Ear: External ear normal.  Cardiovascular: Regular rhythm and normal heart sounds.  Exam reveals no gallop and no friction rub.   No murmur heard. Slightly tachycardic  Pulmonary/Chest: Effort normal and breath sounds normal. No respiratory distress. She has no wheezes. She has no  rales. She exhibits no tenderness.  Neurological: She is alert and oriented to person, place, and time. No cranial nerve deficit. She exhibits normal muscle tone. Coordination normal.  Skin: Skin is warm and dry. No rash noted. She is not diaphoretic.  Psychiatric: She has a normal mood and affect. Her behavior is normal. Judgment and thought content normal.      Assessment & Plan:   Kristin Mccarthy was seen today for medication consult.  Diagnoses and all orders for this visit:  Attention deficit hyperactivity disorder (ADHD), unspecified ADHD type  Insomnia  Obese  Other orders -     Discontinue: dextroamphetamine (DEXEDRINE SPANSULE) 15 MG 24 hr capsule; Take 1 capsule (15 mg total) by mouth every morning. -     Discontinue: dextroamphetamine (DEXEDRINE SPANSULE) 15 MG 24 hr capsule; Take 1 capsule (15 mg total) by mouth every morning. -     dextroamphetamine (DEXEDRINE SPANSULE) 15 MG 24 hr capsule; Take 1 capsule (15 mg total) by mouth every morning.   I have discontinued Ms. Weiss's traZODone, vitamin B-12, LORYNA, VYVANSE, lamoTRIgine, topiramate, cyclobenzaprine, and esomeprazole. I am also having her maintain her Dapsone, MELATONIN ER PO, TAZORAC, cetirizine, ALPRAZolam, sertraline, drospirenone-ethinyl estradiol, doxepin, fluticasone, and dextroamphetamine.  Meds ordered this encounter  Medications  . drospirenone-ethinyl estradiol (YAZ,GIANVI,LORYNA) 3-0.02 MG tablet    Sig: take 1 tablet by mouth daily  . DISCONTD: dextroamphetamine (DEXEDRINE SPANSULE) 15 MG 24 hr capsule    Sig: Take 15 mg by mouth every morning. Reported on 11/12/2015    Refill:  0  . doxepin (SINEQUAN) 10 MG/ML solution    Sig:     Refill:  0  . fluticasone (FLONASE) 50 MCG/ACT nasal spray    Sig: Reported on 11/12/2015    Refill:  0  . DISCONTD: dextroamphetamine (DEXEDRINE SPANSULE) 15 MG 24 hr capsule    Sig: Take 1 capsule (15 mg total) by mouth every morning.    Dispense:  30 capsule     Refill:  0    Fill on or after Jan. 11th, 2017    Order Specific Question:  Supervising Provider    Answer:  Duncan DullULLO, TERESA L [[id number]]  . DISCONTD: dextroamphetamine (DEXEDRINE SPANSULE) 15 MG 24 hr capsule    Sig: Take 1 capsule (15 mg total) by mouth every morning.    Dispense:  30 capsule    Refill:  0    Fill on or after Feb 11th, 2017    Order Specific Question:  Supervising Provider    Answer:  Duncan DullULLO, TERESA L [[id number]]  . dextroamphetamine (DEXEDRINE SPANSULE) 15 MG 24 hr capsule    Sig: Take 1 capsule (15 mg total) by mouth every morning.    Dispense:  30 capsule    Refill:  0    Fill on or after March 11th, 2017    Order Specific Question:  Supervising Provider    Answer:  Sherlene ShamsULLO, TERESA L [[id number]]     Follow-up: Return in about 3 months (around 02/10/2016)  for Follow up.

## 2015-11-16 DIAGNOSIS — G4700 Insomnia, unspecified: Secondary | ICD-10-CM | POA: Insufficient documentation

## 2015-11-16 DIAGNOSIS — F909 Attention-deficit hyperactivity disorder, unspecified type: Secondary | ICD-10-CM | POA: Insufficient documentation

## 2015-11-16 DIAGNOSIS — E669 Obesity, unspecified: Secondary | ICD-10-CM | POA: Insufficient documentation

## 2015-11-16 NOTE — Assessment & Plan Note (Signed)
New onset Melatonin not helpful Doxepin not helpful  Pt and therapist believe it is psychologically induced  Sleeps well on weekends

## 2015-11-16 NOTE — Assessment & Plan Note (Addendum)
Taking over medications for Dr. Imogene Burnhen who has moved Dexedrine spansule 3 months printed and given to pt  FU in 3 months CSC signed today

## 2015-11-16 NOTE — Assessment & Plan Note (Signed)
Established worsening Wt Readings from Last 3 Encounters:  11/12/15 213 lb 9.6 oz (96.888 kg)  11/20/14 188 lb 1.9 oz (85.331 kg)  10/23/14 187 lb 12.8 oz (85.186 kg)   Discussed making better choices, low carb  Pt is not motivated at this time FU in 3 months

## 2016-01-30 ENCOUNTER — Other Ambulatory Visit: Payer: Self-pay | Admitting: Nurse Practitioner

## 2016-02-02 ENCOUNTER — Other Ambulatory Visit: Payer: Self-pay | Admitting: Nurse Practitioner

## 2016-02-02 NOTE — Telephone Encounter (Signed)
Historical medication. Please advise? 

## 2016-06-04 ENCOUNTER — Telehealth: Payer: Self-pay | Admitting: *Deleted

## 2016-06-04 ENCOUNTER — Other Ambulatory Visit: Payer: Self-pay | Admitting: Family Medicine

## 2016-06-04 DIAGNOSIS — F419 Anxiety disorder, unspecified: Secondary | ICD-10-CM

## 2016-06-04 DIAGNOSIS — F988 Other specified behavioral and emotional disorders with onset usually occurring in childhood and adolescence: Secondary | ICD-10-CM

## 2016-06-04 NOTE — Telephone Encounter (Signed)
Needs to see psychiatry. Will place referral.

## 2016-06-04 NOTE — Telephone Encounter (Signed)
Kristin Mccarthy please see previous message. Patient wants to know if Kristin Mccarthy will prescribe her a psych med that Kristin Mccarthy prescribed her when she was her provider. The medicine is Adderall. Thanks .

## 2016-06-04 NOTE — Telephone Encounter (Signed)
Patient called states need a appt with a need provider. She seen Naomie Dean. Patient was seeing a Psychiatrist and her Psych Dr left. Lyla Son was prescribing her the medication. Pt states she is almost out of that medication the Psychiatrist Dr original prescribed. She was wondering if Dr. Adriana Simas would take that over or if he would refer her out to another Psychiatrist. She has an appt to see Dr. Adriana Simas on Aug 17 for est care. Pt is requesting a call back. Her number is [phone]  Please advise. Thanks

## 2016-06-04 NOTE — Telephone Encounter (Signed)
Notified pt. 

## 2016-06-04 NOTE — Telephone Encounter (Signed)
Can we do this ?

## 2016-06-04 NOTE — Telephone Encounter (Signed)
I do not see where she is currently on Adderall. Please check to see if she is talking about the Dexedrine. Thanks.

## 2016-06-07 NOTE — Telephone Encounter (Signed)
Spoke with patient and she is taking the dexedrine. Per Dr. Birdie SonsSonnenberg ok  Schedule and he will refill. Scheduled 06/17/16

## 2016-06-17 ENCOUNTER — Ambulatory Visit (INDEPENDENT_AMBULATORY_CARE_PROVIDER_SITE_OTHER): Payer: 59 | Admitting: Family Medicine

## 2016-06-17 ENCOUNTER — Encounter: Payer: Self-pay | Admitting: Family Medicine

## 2016-06-17 ENCOUNTER — Ambulatory Visit: Payer: 59 | Admitting: Family Medicine

## 2016-06-17 VITALS — BP 116/82 | HR 103 | Temp 98.1°F | Wt 207.4 lb

## 2016-06-17 DIAGNOSIS — M25561 Pain in right knee: Secondary | ICD-10-CM | POA: Diagnosis not present

## 2016-06-17 DIAGNOSIS — R000 Tachycardia, unspecified: Secondary | ICD-10-CM

## 2016-06-17 DIAGNOSIS — K219 Gastro-esophageal reflux disease without esophagitis: Secondary | ICD-10-CM | POA: Diagnosis not present

## 2016-06-17 DIAGNOSIS — F909 Attention-deficit hyperactivity disorder, unspecified type: Secondary | ICD-10-CM

## 2016-06-17 MED ORDER — RANITIDINE HCL 150 MG PO TABS: 150.0000 mg | ORAL_TABLET | Freq: Two times a day (BID) | ORAL | 1 refills | Status: DC

## 2016-06-17 MED ORDER — DEXTROAMPHETAMINE SULFATE ER 15 MG PO CP24: 15.0000 mg | ORAL_CAPSULE | Freq: Every morning | ORAL | 0 refills | Status: DC

## 2016-06-17 MED ORDER — DEXTROAMPHETAMINE SULFATE ER 15 MG PO CP24: 15.0000 mg | ORAL_CAPSULE | Freq: Every day | ORAL | 0 refills | Status: DC

## 2016-06-17 NOTE — Assessment & Plan Note (Signed)
Likely patellofemoral pain syndrome. Patient was given exercises to complete. Can use anti-inflammatories over-the-counter. Ice as well. If not improving with exercises she will let us know.

## 2016-06-17 NOTE — Assessment & Plan Note (Addendum)
Well-controlled on current dosing of Dexedrine though patient has tachycardia today. Tachycardia could be related to Dexedrine and or anxiety. Suspect more likely anxiety she reports she gets quite anxious when going to the doctor's office. No additional symptoms with this. EKG performed revealing sinus tachycardia. Additionally patient does report that her heart rate is typically in the normal range at home. Much improved heart rate on recheck with EKG. Discussed monitoring her heart rate at home and calling us if it is persistently greater than 100. If this is the case we will need to back off on her Dexedrine. She was given refills and she will continue to monitor. She's given return precautions.

## 2016-06-17 NOTE — Assessment & Plan Note (Signed)
I suspect her phlegm sensation in her throat is related to reflux. No other symptoms with this. We will try her on Zantac to see if this is of benefit.

## 2016-06-17 NOTE — Progress Notes (Signed)
Kristin AlarEric Brookie Wayment, MD Phone: [phone]  Kristin CooperKimberly Mccarthy is a 40 y.o. female who presents today for follow-up.  ADHD: Patient notes she has been on Dexedrine for a number of years. This has been very beneficial. Helps her focus. Doesn't take it on weekends. No appetite suppression or weight loss. No issues with palpitations. Heart rate was slightly increased at her last office visit. is not having any chest pain or shortness of breath.  Patient notes she wakes up in the morning with some phlegm and feels the need to clear her throat. In the past she has been on Nexium and this took this sensation we though gave her heartburn. No cough or wheezing. No shortness of breath. No sensation of heartburn.  Right knee pain: Notes this is been going on for last 15 years. Hurts and crackles. Hurts when going up stairs. No injury. No swelling. Has not taken any medication or done any exercises for this.  PMH: Former smoker   ROS see history of present illness  Objective  Physical Exam Vitals:   06/17/16 1450 06/17/16 2010  BP: 116/82   Pulse: (!) 122 (!) 103  Temp: 98.1 F (36.7 C)     BP Readings from Last 3 Encounters:  06/17/16 116/82  11/12/15 104/78  11/20/14 118/72   Wt Readings from Last 3 Encounters:  06/17/16 207 lb 6.4 oz (94.1 kg)  11/12/15 213 lb 9.6 oz (96.9 kg)  11/20/14 188 lb 1.9 oz (85.3 kg)    Physical Exam  Constitutional: No distress.  HENT:  Head: Normocephalic and atraumatic.  Cardiovascular: Regular rhythm and normal heart sounds.  Tachycardia present.   Pulmonary/Chest: Effort normal and breath sounds normal.  Musculoskeletal:  Bilateral knees with no joint line tenderness, swelling, warmth or erythema, no ligament laxity, negative McMurray's, there is crepitus on extension of bilateral knees  Neurological: She is alert. Gait normal.  Skin: Skin is warm and dry. She is not diaphoretic.   EKG: Sinus tachycardia, rate 103, no ST or T-wave  changes  Assessment/Plan: Please see individual problem list.  GERD (gastroesophageal reflux disease) I suspect her phlegm sensation in her throat is related to reflux. No other symptoms with this. We will try her on Zantac to see if this is of benefit.  Attention deficit hyperactivity disorder (ADHD) Well-controlled on current dosing of Dexedrine though patient has tachycardia today. Tachycardia could be related to Dexedrine and or anxiety. Suspect more likely anxiety she reports she gets quite anxious when going to the doctor's office. No additional symptoms with this. EKG performed revealing sinus tachycardia. Additionally patient does report that her heart rate is typically in the normal range at home. Much improved heart rate on recheck with EKG. Discussed monitoring her heart rate at home and calling us if it is persistently greater than 100. If this is the case we will need to back off on her Dexedrine. She was given refills and she will continue to monitor. She's given return precautions.  Right knee pain Likely patellofemoral pain syndrome. Patient was given exercises to complete. Can use anti-inflammatories over-the-counter. Ice as well. If not improving with exercises she will let us know.   Orders Placed This Encounter  Procedures  . EKG 12-Lead    Meds ordered this encounter  Medications  . traZODone (DESYREL) 150 MG tablet    Sig: Take by mouth at bedtime.  . ranitidine (ZANTAC) 150 MG tablet    Sig: Take 1 tablet (150 mg total) by mouth 2 (two) times  daily.    Dispense:  60 tablet    Refill:  1  . dextroamphetamine (DEXEDRINE SPANSULE) 15 MG 24 hr capsule    Sig: Take 1 capsule (15 mg total) by mouth every morning.    Dispense:  30 capsule    Refill:  0  . dextroamphetamine (DEXEDRINE SPANSULE) 15 MG 24 hr capsule    Sig: Take 1 capsule (15 mg total) by mouth daily. Do not fill until 07/18/16    Dispense:  30 capsule    Refill:  0  . dextroamphetamine (DEXEDRINE  SPANSULE) 15 MG 24 hr capsule    Sig: Take 1 capsule (15 mg total) by mouth daily. Do not fill until 08/17/16    Dispense:  30 capsule    Refill:  0    Kristin AlarEric Masae Lukacs, MD [facility] [place]

## 2016-06-17 NOTE — Progress Notes (Signed)
Pre visit review using our clinic review tool, if applicable. No additional management support is needed unless otherwise documented below in the visit note. 

## 2016-06-17 NOTE — Patient Instructions (Signed)
Nice to meet you. We will have you monitor your pulse at home and call us early next week to let us know what it is been. We will start her on Zantac for potential reflux.  We will also have you do exercises for your right knee. If you have palpitations, chest pain, or shortness of breath please seek medical attention.

## 2016-06-18 ENCOUNTER — Other Ambulatory Visit: Payer: Self-pay | Admitting: Family Medicine

## 2016-06-18 ENCOUNTER — Encounter: Payer: Self-pay | Admitting: Family Medicine

## 2016-06-18 MED ORDER — DROSPIRENONE-ETHINYL ESTRADIOL 3-0.02 MG PO TABS: 1.0000 | ORAL_TABLET | Freq: Every day | ORAL | 2 refills | Status: DC

## 2016-08-04 ENCOUNTER — Other Ambulatory Visit: Payer: Self-pay | Admitting: Family Medicine

## 2016-08-04 ENCOUNTER — Other Ambulatory Visit: Payer: Self-pay | Admitting: Nurse Practitioner

## 2016-08-04 ENCOUNTER — Encounter: Payer: Self-pay | Admitting: Family Medicine

## 2016-08-04 MED ORDER — ALPRAZOLAM 0.5 MG PO TABS: 0.5000 mg | ORAL_TABLET | Freq: Two times a day (BID) | ORAL | 0 refills | Status: DC | PRN

## 2016-08-04 MED ORDER — SERTRALINE HCL 25 MG PO TABS: 75.0000 mg | ORAL_TABLET | Freq: Every day | ORAL | 3 refills | Status: DC

## 2016-08-04 NOTE — Telephone Encounter (Signed)
Spoke with patient and she only takes the xanax every once in a while. She may take it once a month, but when she does it is either 2 or 3 tablets

## 2016-08-04 NOTE — Telephone Encounter (Signed)
Printed. Prescription is for Xanax 0.5 mg tablets take 1 tablet twice daily as needed.

## 2016-08-04 NOTE — Telephone Encounter (Signed)
Kristin CooperKimberly Mccarthy would like a refill of the following medications:     ALPRAZolam (XANAX) 0.25 MG tablet Kristin Mccarthy[Carrie M Doss, NP]   Patient Comment: When I take this I typically take 2, can I get .5 instead of .25?    Preferred pharmacy: [facility] [place] - [address]

## 2016-08-04 NOTE — Telephone Encounter (Signed)
Please determine how often she is taking this as it appears she has not had this refilled since 2016. Thanks.

## 2016-08-04 NOTE — Telephone Encounter (Signed)
Faxed RX 

## 2016-08-10 ENCOUNTER — Other Ambulatory Visit: Payer: Self-pay | Admitting: Family Medicine

## 2016-09-07 ENCOUNTER — Other Ambulatory Visit: Payer: Self-pay | Admitting: Family Medicine

## 2016-09-17 ENCOUNTER — Ambulatory Visit: Payer: 59 | Admitting: Family Medicine

## 2016-09-26 ENCOUNTER — Encounter: Payer: Self-pay | Admitting: Family Medicine

## 2016-10-05 ENCOUNTER — Encounter: Payer: Self-pay | Admitting: Family Medicine

## 2016-10-05 ENCOUNTER — Ambulatory Visit (INDEPENDENT_AMBULATORY_CARE_PROVIDER_SITE_OTHER): Payer: 59 | Admitting: Family Medicine

## 2016-10-05 DIAGNOSIS — G4700 Insomnia, unspecified: Secondary | ICD-10-CM | POA: Diagnosis not present

## 2016-10-05 DIAGNOSIS — E6609 Other obesity due to excess calories: Secondary | ICD-10-CM

## 2016-10-05 DIAGNOSIS — J069 Acute upper respiratory infection, unspecified: Secondary | ICD-10-CM | POA: Diagnosis not present

## 2016-10-05 DIAGNOSIS — F909 Attention-deficit hyperactivity disorder, unspecified type: Secondary | ICD-10-CM | POA: Diagnosis not present

## 2016-10-05 MED ORDER — DEXTROAMPHETAMINE SULFATE ER 15 MG PO CP24: 15.0000 mg | ORAL_CAPSULE | Freq: Every morning | ORAL | 0 refills | Status: DC

## 2016-10-05 MED ORDER — DEXTROAMPHETAMINE SULFATE ER 15 MG PO CP24: 15.0000 mg | ORAL_CAPSULE | Freq: Every day | ORAL | 0 refills | Status: DC

## 2016-10-05 MED ORDER — TRAZODONE HCL 50 MG PO TABS: 50.0000 mg | ORAL_TABLET | Freq: Every evening | ORAL | 1 refills | Status: DC | PRN

## 2016-10-05 NOTE — Assessment & Plan Note (Signed)
Patient had symptoms consistent with viral illness. Has resolved. Benign exam. She'll monitor for recurrence.

## 2016-10-05 NOTE — Assessment & Plan Note (Signed)
Working on staying more active and working on diet.

## 2016-10-05 NOTE — Progress Notes (Signed)
Kristin AlarEric Clytie Shetley, MD Phone: [phone]  Kristin CooperKimberly Mccarthy is a 40 y.o. female who presents today for f/u.  ADHD: Stable on dextran. Notes she did come off this for a short period of time and was very scatterbrained. No weight changes. No appetite suppression. Does have issues with sleep though this is chronic and unchanged. No palpitations. Her heart rate on her fitbit has been 77-86.  Notes lots issues sleeping related to anxiety. Currently on Zoloft. Also taking trazodone which she had left over from her dog. She was previously on 150 mg of trazodone. Currently taking 50 mg. Note she does watch a lot of TV and use her phone and use her computer prior to bed. Previously saw a psychiatrist.  Obesity: Has gotten a fitbit which has helped her stay more active. She does get up and walk around throughout the day. She's changing her eating habits by getting a meal service that brings home-cooked meals to her house. She has not lost any weight yet.  She notes she was sick about a week ago with upper respiratory congestion and cough. Notes her symptoms have improved. She used over-the-counter medications and an inhaler. Asymptomatic currently.  PMH: Former smoker   ROS see history of present illness  Objective  Physical Exam Vitals:   10/05/16 1601  BP: 138/86  Pulse: 88  Temp: 98.2 F (36.8 C)    BP Readings from Last 3 Encounters:  10/05/16 138/86  06/17/16 116/82  11/12/15 104/78   Wt Readings from Last 3 Encounters:  10/05/16 216 lb 6.4 oz (98.2 kg)  06/17/16 207 lb 6.4 oz (94.1 kg)  11/12/15 213 lb 9.6 oz (96.9 kg)    Physical Exam  Constitutional: No distress.  HENT:  Head: Normocephalic and atraumatic.  Mouth/Throat: Oropharynx is clear and moist. No oropharyngeal exudate.  Normal TMs  Eyes: Conjunctivae are normal. Pupils are equal, round, and reactive to light.  Cardiovascular: Normal rate, regular rhythm and normal heart sounds.   Pulmonary/Chest: Effort normal  and breath sounds normal.  Musculoskeletal: She exhibits no edema.  Neurological: She is alert. Gait normal.  Skin: Skin is warm and dry. She is not diaphoretic.  Psychiatric: Mood and affect normal.     Assessment/Plan: Please see individual problem list.  Obese Working on staying more active and working on diet.  Insomnia Trazodone has been helpful. Suspect related to anxiety. She will work on sleep hygiene. She will continue zoloft and trazodone. If worsens she will let us know.   Attention deficit hyperactivity disorder (ADHD) Well-controlled. Tolerating Dexedrine. She'll continue this. Refills given.  Acute upper respiratory infection Patient had symptoms consistent with viral illness. Has resolved. Benign exam. She'll monitor for recurrence.   No orders of the defined types were placed in this encounter.   Meds ordered this encounter  Medications  . dextroamphetamine (DEXEDRINE SPANSULE) 15 MG 24 hr capsule    Sig: Take 1 capsule (15 mg total) by mouth every morning.    Dispense:  30 capsule    Refill:  0  . dextroamphetamine (DEXEDRINE SPANSULE) 15 MG 24 hr capsule    Sig: Take 1 capsule (15 mg total) by mouth daily. Do not fill until 11/05/16    Dispense:  30 capsule    Refill:  0  . dextroamphetamine (DEXEDRINE SPANSULE) 15 MG 24 hr capsule    Sig: Take 1 capsule (15 mg total) by mouth daily. Do not fill until 12/06/16    Dispense:  30 capsule    Refill:  0  . traZODone (DESYREL) 50 MG tablet    Sig: Take 1 tablet (50 mg total) by mouth at bedtime as needed for sleep.    Dispense:  30 tablet    Refill:  1    Kristin AlarEric Kristin Turner, MD [facility] [place]

## 2016-10-05 NOTE — Assessment & Plan Note (Signed)
Well-controlled. Tolerating Dexedrine. She'll continue this. Refills given.

## 2016-10-05 NOTE — Assessment & Plan Note (Signed)
Trazodone has been helpful. Suspect related to anxiety. She will work on sleep hygiene. She will continue zoloft and trazodone. If worsens she will let us know.

## 2016-10-05 NOTE — Patient Instructions (Signed)
Nice to see you. We will refill your dextran. Please start on trazodone 50 mg nightly for sleep. Please monitor diet. Please try to stay active. Please do not use any screens the hour prior to bed.

## 2016-10-05 NOTE — Progress Notes (Signed)
Pre visit review using our clinic review tool, if applicable. No additional management support is needed unless otherwise documented below in the visit note. 

## 2016-10-09 ENCOUNTER — Other Ambulatory Visit: Payer: Self-pay | Admitting: Family Medicine

## 2016-10-12 ENCOUNTER — Encounter: Payer: Self-pay | Admitting: Family Medicine

## 2016-10-28 ENCOUNTER — Encounter: Payer: Self-pay | Admitting: Family Medicine

## 2016-10-29 ENCOUNTER — Other Ambulatory Visit: Payer: Self-pay | Admitting: Family Medicine

## 2016-10-29 MED ORDER — BECLOMETHASONE DIPROPIONATE 40 MCG/ACT IN AERS: 1.0000 | INHALATION_SPRAY | Freq: Two times a day (BID) | RESPIRATORY_TRACT | 3 refills | Status: DC

## 2016-11-04 ENCOUNTER — Other Ambulatory Visit: Payer: Self-pay | Admitting: Family Medicine

## 2016-11-04 NOTE — Telephone Encounter (Signed)
Last filled 06/18/2016 #1 2rf,lmtrc to verify patient is still taking

## 2016-11-29 ENCOUNTER — Encounter: Payer: Self-pay | Admitting: Family Medicine

## 2016-12-02 ENCOUNTER — Other Ambulatory Visit: Payer: Self-pay | Admitting: Family Medicine

## 2016-12-02 MED ORDER — DEXTROAMPHETAMINE SULFATE ER 15 MG PO CP24: 15.0000 mg | ORAL_CAPSULE | Freq: Every day | ORAL | 0 refills | Status: DC

## 2016-12-02 MED ORDER — SERTRALINE HCL 25 MG PO TABS: 75.0000 mg | ORAL_TABLET | Freq: Every day | ORAL | 3 refills | Status: DC

## 2016-12-02 MED ORDER — DEXTROAMPHETAMINE SULFATE ER 15 MG PO CP24: 15.0000 mg | ORAL_CAPSULE | Freq: Every morning | ORAL | 0 refills | Status: DC

## 2016-12-02 MED ORDER — TRAZODONE HCL 50 MG PO TABS: 50.0000 mg | ORAL_TABLET | Freq: Every evening | ORAL | 1 refills | Status: DC | PRN

## 2016-12-03 ENCOUNTER — Telehealth: Payer: Self-pay | Admitting: Family Medicine

## 2016-12-03 MED ORDER — DEXTROAMPHETAMINE SULFATE ER 15 MG PO CP24: 15.0000 mg | ORAL_CAPSULE | Freq: Every day | ORAL | 0 refills | Status: DC

## 2016-12-03 MED ORDER — DEXTROAMPHETAMINE SULFATE ER 15 MG PO CP24: 15.0000 mg | ORAL_CAPSULE | Freq: Every morning | ORAL | 0 refills | Status: DC

## 2016-12-03 NOTE — Telephone Encounter (Signed)
dextroamphetamine (DEXEDRINE SPANSULE) 15 MG 24 hr capsule needs to be sent electronically cannot be faxed because it is controlled.

## 2016-12-06 ENCOUNTER — Ambulatory Visit: Payer: 59 | Admitting: Family Medicine

## 2016-12-27 ENCOUNTER — Ambulatory Visit: Payer: 59 | Admitting: Family Medicine

## 2017-01-03 NOTE — Telephone Encounter (Signed)
Pa Started on cover my meds

## 2017-01-04 ENCOUNTER — Other Ambulatory Visit: Payer: Self-pay | Admitting: Family Medicine

## 2017-01-09 ENCOUNTER — Other Ambulatory Visit: Payer: Self-pay | Admitting: Family Medicine

## 2017-01-10 ENCOUNTER — Other Ambulatory Visit: Payer: Self-pay | Admitting: Family Medicine

## 2017-01-10 MED ORDER — BECLOMETHASONE DIPROPIONATE 40 MCG/ACT IN AERS: 1.0000 | INHALATION_SPRAY | Freq: Two times a day (BID) | RESPIRATORY_TRACT | 3 refills | Status: DC

## 2017-01-10 NOTE — Telephone Encounter (Signed)
Sent to pharmacy 

## 2017-01-10 NOTE — Telephone Encounter (Signed)
[facility] called looking for a new rx for pt's beclomethasone (QVAR) 40 MCG/ACT inhaler. Please advise, thank you!  Call [phone] Ref # [id number]

## 2017-01-10 NOTE — Telephone Encounter (Signed)
Last OV 10/05/16 last filled 10/29/16 1 3rf

## 2017-01-16 ENCOUNTER — Other Ambulatory Visit: Payer: Self-pay | Admitting: Family Medicine

## 2017-01-17 NOTE — Telephone Encounter (Signed)
Last OV 10/05/16 last filled 12/02/16 30 1rf

## 2017-01-24 ENCOUNTER — Ambulatory Visit: Payer: 59 | Admitting: Family Medicine

## 2017-02-18 ENCOUNTER — Ambulatory Visit (INDEPENDENT_AMBULATORY_CARE_PROVIDER_SITE_OTHER): Payer: 59 | Admitting: Podiatry

## 2017-02-18 ENCOUNTER — Ambulatory Visit: Payer: 59 | Admitting: Podiatry

## 2017-02-18 DIAGNOSIS — M7672 Peroneal tendinitis, left leg: Secondary | ICD-10-CM | POA: Diagnosis not present

## 2017-02-18 DIAGNOSIS — M779 Enthesopathy, unspecified: Secondary | ICD-10-CM

## 2017-02-18 DIAGNOSIS — M778 Other enthesopathies, not elsewhere classified: Secondary | ICD-10-CM

## 2017-02-18 DIAGNOSIS — M7671 Peroneal tendinitis, right leg: Secondary | ICD-10-CM

## 2017-02-19 ENCOUNTER — Encounter: Payer: Self-pay | Admitting: Podiatry

## 2017-02-20 NOTE — Progress Notes (Signed)
   Subjective: Patient is a 41 year old female presenting today as a new patient complaining of sharp, throbbing bilateral foot pain that has been present for the past 4 months. She states the pain is located mainly on the sides of the feet. She states she walks barefoot frequently due to working at home. Walking exacerbates her pain. She denies any trauma or other complaints.    Objective/Physical Exam General: The patient is alert and oriented x3 in no acute distress.  Dermatology: Skin is warm, dry and supple bilateral lower extremities. Negative for open lesions or macerations.  Vascular: Palpable pedal pulses bilaterally. No edema or erythema noted. Capillary refill within normal limits.  Neurological: Epicritic and protective threshold grossly intact bilaterally.   Musculoskeletal Exam: Pain with palpation to peroneal tendons bilaterally. Range of motion within normal limits to all pedal and ankle joints bilateral. Muscle strength 5/5 in all groups bilateral.    Assessment: #1 Peroneal enthesopathy bilateral   Plan of Care:  #1 Patient was evaluated. #2 Injection of 0.5 mLs Celestone Soluspan injected into bilateral peroneal tendons. Care was taken to avoid direct injection into the tendon. #3 prescription for Meloxicam 15 mg provided. #4 Recommended good supportive shoes from [employer] #5 Return to clinic in 4 weeks.    Felecia Shelling, DPM [facility]  Dr. Felecia Shelling, DPM    [address]                                        [place], [place] [address]                Office [phone]  Fax [phone]

## 2017-02-21 ENCOUNTER — Telehealth: Payer: Self-pay | Admitting: *Deleted

## 2017-02-21 MED ORDER — MELOXICAM 15 MG PO TABS: 15.0000 mg | ORAL_TABLET | Freq: Every day | ORAL | 1 refills | Status: DC

## 2017-02-21 NOTE — Telephone Encounter (Signed)
Pt left email that the antiinflammatory had not been called to the [facility]. I left message informing to the [facility].

## 2017-02-27 MED ORDER — BETAMETHASONE SOD PHOS & ACET 6 (3-3) MG/ML IJ SUSP: 3.0000 mg | Freq: Once | INTRAMUSCULAR | Status: AC

## 2017-03-13 ENCOUNTER — Other Ambulatory Visit: Payer: Self-pay | Admitting: Family Medicine

## 2017-03-14 ENCOUNTER — Other Ambulatory Visit: Payer: Self-pay | Admitting: Family Medicine

## 2017-03-14 NOTE — Telephone Encounter (Signed)
Please determine what she takes this for her. Thanks.

## 2017-03-14 NOTE — Telephone Encounter (Signed)
Last OV 10/05/16 last filled 08/04/16 60 0rf

## 2017-03-16 NOTE — Telephone Encounter (Signed)
Patient states she takes this for anxiety when she goes out in crowds and during stressfull periods only as needed

## 2017-03-16 NOTE — Telephone Encounter (Signed)
Left message to return call 

## 2017-03-16 NOTE — Telephone Encounter (Signed)
faxed

## 2017-03-18 ENCOUNTER — Ambulatory Visit: Payer: 59 | Admitting: Podiatry

## 2017-03-29 ENCOUNTER — Ambulatory Visit (INDEPENDENT_AMBULATORY_CARE_PROVIDER_SITE_OTHER): Payer: 59 | Admitting: Podiatry

## 2017-03-29 DIAGNOSIS — M7671 Peroneal tendinitis, right leg: Secondary | ICD-10-CM

## 2017-03-29 DIAGNOSIS — M7672 Peroneal tendinitis, left leg: Secondary | ICD-10-CM

## 2017-03-31 NOTE — Progress Notes (Signed)
   Subjective: Patient is a 41 year old female presenting today for follow-up evaluation of bilateral peroneal enthesopathy. She states her pain returned about 1.5 weeks ago. She reported some relief from the injections at last visit.   Objective/Physical Exam General: The patient is alert and oriented x3 in no acute distress.  Dermatology: Skin is warm, dry and supple bilateral lower extremities. Negative for open lesions or macerations.  Vascular: Palpable pedal pulses bilaterally. No edema or erythema noted. Capillary refill within normal limits.  Neurological: Epicritic and protective threshold grossly intact bilaterally.   Musculoskeletal Exam: Pain with palpation to peroneal tendons bilaterally. Range of motion within normal limits to all pedal and ankle joints bilateral. Muscle strength 5/5 in all groups bilateral.    Assessment: #1 Peroneal tendinitis/enthesopathy bilateral   Plan of Care:  #1 Patient was evaluated. #2 Injection of 0.5 mLs Celestone Soluspan injected into bilateral peroneal tendons. Care was taken to avoid direct injection into the tendon. #3 recommended patient to avoid sitting [place] style to avoid pressure. #4 return to clinic in 4 weeks.    Felecia ShellingBrent M. Evans, DPM [facility]  Dr. Felecia ShellingBrent M. Evans, DPM    [address]                                        [place], [place] [address]                Office [phone]  Fax [phone]

## 2017-04-11 ENCOUNTER — Other Ambulatory Visit: Payer: Self-pay | Admitting: Family Medicine

## 2017-04-18 MED ORDER — BETAMETHASONE SOD PHOS & ACET 6 (3-3) MG/ML IJ SUSP: 3.0000 mg | Freq: Once | INTRAMUSCULAR | Status: AC

## 2017-05-03 ENCOUNTER — Other Ambulatory Visit: Payer: Self-pay

## 2017-05-03 ENCOUNTER — Ambulatory Visit (INDEPENDENT_AMBULATORY_CARE_PROVIDER_SITE_OTHER): Payer: 59 | Admitting: Podiatry

## 2017-05-03 DIAGNOSIS — M7671 Peroneal tendinitis, right leg: Secondary | ICD-10-CM | POA: Diagnosis not present

## 2017-05-03 DIAGNOSIS — M7672 Peroneal tendinitis, left leg: Secondary | ICD-10-CM

## 2017-05-03 MED ORDER — BECLOMETHASONE DIPROP HFA 40 MCG/ACT IN AERB: 1.0000 | INHALATION_SPRAY | Freq: Two times a day (BID) | RESPIRATORY_TRACT | 2 refills | Status: DC

## 2017-05-06 NOTE — Progress Notes (Signed)
   Subjective: Patient is a 41 year old female presenting today for follow-up evaluation of bilateral peroneal enthesopathy. She states her pain has not improved at all. She reports continued pain to the lateral sides of bilateral forefeet. She describes the pain as if she is walking on a stone. She states she experienced no relief from the injection.   Objective/Physical Exam General: The patient is alert and oriented x3 in no acute distress.  Dermatology: Skin is warm, dry and supple bilateral lower extremities. Negative for open lesions or macerations.  Vascular: Palpable pedal pulses bilaterally. No edema or erythema noted. Capillary refill within normal limits.  Neurological: Epicritic and protective threshold grossly intact bilaterally.   Musculoskeletal Exam: Pain with palpation to peroneal tendons bilaterally. Range of motion within normal limits to all pedal and ankle joints bilateral. Muscle strength 5/5 in all groups bilateral.    Assessment: #1 Peroneal tendinitis/enthesopathy bilateral, left worse than right   Plan of Care:  #1 Patient was evaluated. #2 Injection of 0.5 mLs Celestone Soluspan injected into bilateral peroneal tendons. Care was taken to avoid direct injection into the tendon. #3 today we tried to dispensing immobilization cam boot for the left lower extremity. The boot was uncomfortable and the patient is unable tolerate. She will shop online for an immobilization cam boot to wear for the next 4 weeks. #4 Continue oral antiinflammatory.  #5 Return to clinic in 4 weeks.     Felecia ShellingBrent M. Evans, DPM [facility]  Dr. Felecia ShellingBrent M. Evans, DPM    [address]                                        [place], [place] [address]                Office [phone]  Fax [phone]) [phone]

## 2017-05-07 MED ORDER — BETAMETHASONE SOD PHOS & ACET 6 (3-3) MG/ML IJ SUSP: 3.0000 mg | Freq: Once | INTRAMUSCULAR | Status: AC

## 2017-05-10 ENCOUNTER — Other Ambulatory Visit: Payer: Self-pay | Admitting: Family Medicine

## 2017-05-18 ENCOUNTER — Other Ambulatory Visit: Payer: Self-pay | Admitting: Family Medicine

## 2017-05-31 ENCOUNTER — Ambulatory Visit (INDEPENDENT_AMBULATORY_CARE_PROVIDER_SITE_OTHER): Payer: 59 | Admitting: Podiatry

## 2017-05-31 DIAGNOSIS — M7672 Peroneal tendinitis, left leg: Secondary | ICD-10-CM

## 2017-05-31 DIAGNOSIS — M7671 Peroneal tendinitis, right leg: Secondary | ICD-10-CM

## 2017-05-31 MED ORDER — BETAMETHASONE SOD PHOS & ACET 6 (3-3) MG/ML IJ SUSP: 3.0000 mg | Freq: Once | INTRAMUSCULAR | Status: AC

## 2017-05-31 NOTE — Progress Notes (Signed)
   Subjective: Patient presents today for follow-up treatment and evaluation of insertional peroneal tendinitis the bilateral feet left worse than the right. Patient believes that she knows why she is experiencing the lateral foot pain. Patient has been putting birdfeeder's in her backyard and stomping on the Shepherd hallux to drive them into the ground. She's been doing this constantly for the past month or so and she believes her foot pain is coming from that. The patient also purchased a immobilization cam boot online and was able to wear for 1 week. Patient states that during that week she felt significant improvement.   Objective/Physical Exam General: The patient is alert and oriented x3 in no acute distress.  Dermatology: Skin is warm, dry and supple bilateral lower extremities. Negative for open lesions or macerations.  Vascular: Palpable pedal pulses bilaterally. No edema or erythema noted. Capillary refill within normal limits.  Neurological: Epicritic and protective threshold grossly intact bilaterally.   Musculoskeletal Exam: Pain with palpation to peroneal tendons bilaterally. Range of motion within normal limits to all pedal and ankle joints bilateral. Muscle strength 5/5 in all groups bilateral.    Assessment: #1 Peroneal tendinitis/enthesopathy bilateral, left worse than right   Plan of Care:  #1 Patient was evaluated. #2 injection of 0.5 mL Celestone Soluspan injected in the peroneal tendon sheath left lower extremity. #3 the patient is going to discontinue her backyard activities stomping on a shepherd hook #4 at the moment the patient cannot use a immobilization cam boot due to her pets at home. Her dog is towards the end of life and she is going to wear the cam boot after the dog passes away #5 return to clinic when necessary, preferably after 4 weeks of immobilization or at the time of immobilization in the cam boot to receive an additional injection  Also asked to  see if the injection helped on the left versus non-injection on the right  Felecia ShellingBrent M. Evans, DPM [facility]  Dr. Felecia ShellingBrent M. Evans, DPM    [address]                                        [place], [place] [address]                Office [phone]  Fax [phone]

## 2017-07-14 ENCOUNTER — Telehealth: Payer: Self-pay | Admitting: *Deleted

## 2017-07-14 MED ORDER — MELOXICAM 15 MG PO TABS: 15.0000 mg | ORAL_TABLET | Freq: Every day | ORAL | 0 refills | Status: DC

## 2017-07-14 NOTE — Telephone Encounter (Signed)
Received refill request for meloxicam 15mg  #90. Dr. Philomena DohenyEvans okayed refill without additional.

## 2017-07-22 ENCOUNTER — Ambulatory Visit: Payer: 59 | Admitting: Family Medicine

## 2017-07-27 ENCOUNTER — Telehealth: Payer: Self-pay

## 2017-07-27 ENCOUNTER — Ambulatory Visit (INDEPENDENT_AMBULATORY_CARE_PROVIDER_SITE_OTHER): Payer: Managed Care, Other (non HMO) | Admitting: Family

## 2017-07-27 ENCOUNTER — Encounter: Payer: Self-pay | Admitting: Family

## 2017-07-27 DIAGNOSIS — R05 Cough: Secondary | ICD-10-CM

## 2017-07-27 DIAGNOSIS — R059 Cough, unspecified: Secondary | ICD-10-CM

## 2017-07-27 DIAGNOSIS — F909 Attention-deficit hyperactivity disorder, unspecified type: Secondary | ICD-10-CM

## 2017-07-27 NOTE — Telephone Encounter (Signed)
-----   Message from Allegra Grana, FNP sent at 07/27/2017  3:29 PM EDT ----- Patient would like to go back on ADHD medications- she thinks Son[facility] left refills at front desk a couple of months ago. Would you reach out to patient after talking with sonnenberg about ADHD medications and where those refills may be?

## 2017-07-27 NOTE — Telephone Encounter (Signed)
Informed Kristin Mccarthy to notify patient she will need a follow up appmt, patient was last seen 10/05/16

## 2017-07-27 NOTE — Patient Instructions (Addendum)
Trial of antihistamine ( zyrtec, claritin, or allegra), flonase, and MOST importantly nasal saline spray.   Consider in the future a referral to pulmonology if managing the allergens ( above) doesn't help with cough, congestion for formal lung testing.  Dr Purvis Sheffield nurse will be in touch with you about ongoing refills of medication for ADHD. Don't hesitate to call if you have any questions regarding medications.   If there is no improvement in your symptoms, or if there is any worsening of symptoms, or if you have any additional concerns, please return for re-evaluation; or, if we are closed, consider going to the Emergency Room for evaluation if symptoms urgent.   Asthma, Adult Asthma is a recurring condition in which the airways tighten and narrow. Asthma can make it difficult to breathe. It can cause coughing, wheezing, and shortness of breath. Asthma episodes, also called asthma attacks, range from minor to life-threatening. Asthma cannot be cured, but medicines and lifestyle changes can help control it. What are the causes? Asthma is believed to be caused by inherited (genetic) and environmental factors, but its exact cause is unknown. Asthma may be triggered by allergens, lung infections, or irritants in the air. Asthma triggers are different for each person. Common triggers include:  Animal dander.  Dust mites.  Cockroaches.  Pollen from trees or grass.  Mold.  Smoke.  Air pollutants such as dust, household cleaners, hair sprays, aerosol sprays, paint fumes, strong chemicals, or strong odors.  Cold air, weather changes, and winds (which increase molds and pollens in the air).  Strong emotional expressions such as crying or laughing hard.  Stress.  Certain medicines (such as aspirin) or types of drugs (such as beta-blockers).  Sulfites in foods and drinks. Foods and drinks that may contain sulfites include dried fruit, potato chips, and sparkling grape  juice.  Infections or inflammatory conditions such as the flu, a cold, or an inflammation of the nasal membranes (rhinitis).  Gastroesophageal reflux disease (GERD).  Exercise or strenuous activity.  What are the signs or symptoms? Symptoms may occur immediately after asthma is triggered or many hours later. Symptoms include:  Wheezing.  Excessive nighttime or early morning coughing.  Frequent or severe coughing with a common cold.  Chest tightness.  Shortness of breath.  How is this diagnosed? The diagnosis of asthma is made by a review of your medical history and a physical exam. Tests may also be performed. These may include:  Lung function studies. These tests show how much air you breathe in and out.  Allergy tests.  Imaging tests such as X-rays.  How is this treated? Asthma cannot be cured, but it can usually be controlled. Treatment involves identifying and avoiding your asthma triggers. It also involves medicines. There are 2 classes of medicine used for asthma treatment:  Controller medicines. These prevent asthma symptoms from occurring. They are usually taken every day.  Reliever or rescue medicines. These quickly relieve asthma symptoms. They are used as needed and provide short-term relief.  Your health care provider will help you create an asthma action plan. An asthma action plan is a written plan for managing and treating your asthma attacks. It includes a list of your asthma triggers and how they may be avoided. It also includes information on when medicines should be taken and when their dosage should be changed. An action plan may also involve the use of a device called a peak flow meter. A peak flow meter measures how well the lungs are working.  It helps you monitor your condition. Follow these instructions at home:  Take medicines only as directed by your health care provider. Speak with your health care provider if you have questions about how or when to  take the medicines.  Use a peak flow meter as directed by your health care provider. Record and keep track of readings.  Understand and use the action plan to help minimize or stop an asthma attack without needing to seek medical care.  Control your home environment in the following ways to help prevent asthma attacks: ? Do not smoke. Avoid being exposed to secondhand smoke. ? Change your heating and air conditioning filter regularly. ? Limit your use of fireplaces and wood stoves. ? Get rid of pests (such as roaches and mice) and their droppings. ? Throw away plants if you see mold on them. ? Clean your floors and dust regularly. Use unscented cleaning products. ? Try to have someone else vacuum for you regularly. Stay out of rooms while they are being vacuumed and for a short while afterward. If you vacuum, use a dust mask from a hardware store, a double-layered or microfilter vacuum cleaner bag, or a vacuum cleaner with a HEPA filter. ? Replace carpet with wood, tile, or vinyl flooring. Carpet can trap dander and dust. ? Use allergy-proof pillows, mattress covers, and box spring covers. ? Wash bed sheets and blankets every week in hot water and dry them in a dryer. ? Use blankets that are made of polyester or cotton. ? Clean bathrooms and kitchens with bleach. If possible, have someone repaint the walls in these rooms with mold-resistant paint. Keep out of the rooms that are being cleaned and painted. ? Wash hands frequently. Contact a health care provider if:  You have wheezing, shortness of breath, or a cough even if taking medicine to prevent attacks.  The colored mucus you cough up (sputum) is thicker than usual.  Your sputum changes from clear or white to yellow, green, gray, or bloody.  You have any problems that may be related to the medicines you are taking (such as a rash, itching, swelling, or trouble breathing).  You are using a reliever medicine more than 2-3 times per  week.  Your peak flow is still at 50-79% of your personal best after following your action plan for 1 hour.  You have a fever. Get help right away if:  You seem to be getting worse and are unresponsive to treatment during an asthma attack.  You are short of breath even at rest.  You get short of breath when doing very little physical activity.  You have difficulty eating, drinking, or talking due to asthma symptoms.  You develop chest pain.  You develop a fast heartbeat.  You have a bluish color to your lips or fingernails.  You are light-headed, dizzy, or faint.  Your peak flow is less than 50% of your personal best. This information is not intended to replace advice given to you by your health care provider. Make sure you discuss any questions you have with your health care provider. Document Released: 10/18/2005 Document Revised: 03/31/2016 Document Reviewed: 05/17/2013 Elsevier Interactive Patient Education  2017 [employer]  Allergic Rhinitis Allergic rhinitis is when the mucous membranes in the nose respond to allergens. Allergens are particles in the air that cause your body to have an allergic reaction. This causes you to release allergic antibodies. Through a chain of events, these eventually cause you to release histamine into the  blood stream. Although meant to protect the body, it is this release of histamine that causes your discomfort, such as frequent sneezing, congestion, and an itchy, runny nose. What are the causes? Seasonal allergic rhinitis (hay fever) is caused by pollen allergens that may come from grasses, trees, and weeds. Year-round allergic rhinitis (perennial allergic rhinitis) is caused by allergens such as house dust mites, pet dander, and mold spores. What are the signs or symptoms?  Nasal stuffiness (congestion).  Itchy, runny nose with sneezing and tearing of the eyes. How is this diagnosed? Your health care provider can help you determine the  allergen or allergens that trigger your symptoms. If you and your health care provider are unable to determine the allergen, skin or blood testing may be used. Your health care provider will diagnose your condition after taking your health history and performing a physical exam. Your health care provider may assess you for other related conditions, such as asthma, pink eye, or an ear infection. How is this treated? Allergic rhinitis does not have a cure, but it can be controlled by:  Medicines that block allergy symptoms. These may include allergy shots, nasal sprays, and oral antihistamines.  Avoiding the allergen.  Hay fever may often be treated with antihistamines in pill or nasal spray forms. Antihistamines block the effects of histamine. There are over-the-counter medicines that may help with nasal congestion and swelling around the eyes. Check with your health care provider before taking or giving this medicine. If avoiding the allergen or the medicine prescribed do not work, there are many new medicines your health care provider can prescribe. Stronger medicine may be used if initial measures are ineffective. Desensitizing injections can be used if medicine and avoidance does not work. Desensitization is when a patient is given ongoing shots until the body becomes less sensitive to the allergen. Make sure you follow up with your health care provider if problems continue. Follow these instructions at home: It is not possible to completely avoid allergens, but you can reduce your symptoms by taking steps to limit your exposure to them. It helps to know exactly what you are allergic to so that you can avoid your specific triggers. Contact a health care provider if:  You have a fever.  You develop a cough that does not stop easily (persistent).  You have shortness of breath.  You start wheezing.  Symptoms interfere with normal daily activities. This information is not intended to replace  advice given to you by your health care provider. Make sure you discuss any questions you have with your health care provider. Document Released: 07/13/2001 Document Revised: 06/18/2016 Document Reviewed: 06/25/2013 Elsevier Interactive Patient Education  2017 [employer].

## 2017-07-27 NOTE — Telephone Encounter (Signed)
Agree patient needs follow-up.

## 2017-07-27 NOTE — Progress Notes (Signed)
Subjective:    Patient ID: Kristin Mccarthy, female    DOB: [birth year], 40 y.o.   MRN: [id number]  CC: Kristin Mccarthy is a 41 y.o. female who presents today for an acute visit.    HPI:   Chronic cough,  congestion in the mornings several months, unchanged - In the past had been on zyrtec, and qvar inhaler ( which has 'helped tremendously'). Started the qvar 10/2017. No wheezing,fever.  Feels like ears are clogged and itchy. No fever, wheezing, chills, ear pain, sinus pain.   Occasionally takes zyrtec which helps; flonase helps. Not on these medications regularly.   Has never been to allergists; knows allergic to cats. Has dog.   No reflux symptoms. Not taking medication any more for reflux.    ADHD- stopped taking medications and now smoking marijuna. Would like to start back on medication.     Former smoker-   HISTORY:  Past Medical History:  Diagnosis Date  . Acne    Followed by Vaughan Sine  . Gallstones   . GERD (gastroesophageal reflux disease)   . Ovarian cyst    Past Surgical History:  Procedure Laterality Date  . APPENDECTOMY  1997  . BREAST LUMPECTOMY Right 2010   Benign   Family History  Problem Relation Age of Onset  . Hypertension Mother   . Hyperlipidemia Father   . Hypertension Father   . Diabetes Father        Prediabetes  . Heart disease Father   . Heart disease Paternal Uncle   . Heart disease Paternal Grandmother   . Heart disease Paternal Grandfather     Allergies: Patient has no known allergies. Current Outpatient Prescriptions on File Prior to Visit  Medication Sig Dispense Refill  . ALPRAZolam (XANAX) 0.5 MG tablet take 1 tablet by mouth twice a day if needed for anxiety 60 tablet 0  . cetirizine (ZYRTEC) 10 MG tablet Take 10 mg by mouth daily.    . drospirenone-ethinyl estradiol (YAZ,GIANVI,LORYNA) 3-0.02 MG tablet take 1 tablet by mouth daily 28 tablet 1  . MELATONIN ER PO Take by mouth.    . meloxicam (MOBIC) 15 MG tablet Take 1 tablet (15  mg total) by mouth daily. 60 tablet 0  . sertraline (ZOLOFT) 25 MG tablet TAKE 3 TABLETS DAILY 90 tablet 3  . beclomethasone (QVAR REDIHALER) 40 MCG/ACT inhaler Inhale 1 puff into the lungs 2 (two) times daily. (Patient not taking: Reported on 07/27/2017) 1 Inhaler 2  . Dapsone (ACZONE) 5 % topical gel Apply topically 2 (two) times daily.    Marland Kitchen TAZORAC 0.1 % cream     . traZODone (DESYREL) 50 MG tablet TAKE 1 TABLET AT BEDTIME AS NEEDED FOR SLEEP (Patient not taking: Reported on 07/27/2017) 30 tablet 1   Current Facility-Administered Medications on File Prior to Visit  Medication Dose Route Frequency Provider Last Rate Last Dose  . betamethasone acetate-betamethasone sodium phosphate (CELESTONE) injection 3 mg  3 mg Intramuscular Once Gala Lewandowsky M, DPM      . betamethasone acetate-betamethasone sodium phosphate (CELESTONE) injection 3 mg  3 mg Intramuscular Once Gala Lewandowsky M, DPM      . betamethasone acetate-betamethasone sodium phosphate (CELESTONE) injection 3 mg  3 mg Intramuscular Once Gala Lewandowsky M, DPM      . betamethasone acetate-betamethasone sodium phosphate (CELESTONE) injection 3 mg  3 mg Intramuscular Once Felecia Shelling, DPM        Social History  Substance Use Topics  . Smoking status: Former Smoker  Packs/day: 1.00    Quit date: 11/24/2008  . Smokeless tobacco: Not on file  . Alcohol use Yes    Review of Systems  Constitutional: Negative for chills and fever.  HENT: Positive for congestion and postnasal drip. Negative for ear pain, sore throat and trouble swallowing.   Respiratory: Positive for cough. Negative for shortness of breath and wheezing.   Cardiovascular: Negative for chest pain and palpitations.  Gastrointestinal: Negative for nausea and vomiting.      Objective:    BP 132/80 (BP Location: Right Arm, Cuff Size: Large)   Pulse 86   Temp 98.4 F (36.9 C) (Oral)   Ht  (1.575 m)   Wt 222 lb 3.2 oz (100.8 kg)   SpO2 98%   BMI 40.64 kg/m     Physical Exam  Constitutional: She appears well-developed and well-nourished.  HENT:  Head: Normocephalic and atraumatic.  Right Ear: Hearing, tympanic membrane, external ear and ear canal normal. No drainage, swelling or tenderness. No foreign bodies. Tympanic membrane is not erythematous and not bulging. No middle ear effusion. No decreased hearing is noted.  Left Ear: Hearing, tympanic membrane, external ear and ear canal normal. No drainage, swelling or tenderness. No foreign bodies. Tympanic membrane is not erythematous and not bulging.  No middle ear effusion. No decreased hearing is noted.  Nose: Rhinorrhea present. Right sinus exhibits no maxillary sinus tenderness and no frontal sinus tenderness. Left sinus exhibits no maxillary sinus tenderness and no frontal sinus tenderness.  Mouth/Throat: Uvula is midline, oropharynx is clear and moist and mucous membranes are normal. No oropharyngeal exudate, posterior oropharyngeal edema, posterior oropharyngeal erythema or tonsillar abscesses.  Eyes: Conjunctivae are normal.  Cardiovascular: Regular rhythm, normal heart sounds and normal pulses.   Pulmonary/Chest: Effort normal and breath sounds normal. She has no wheezes. She has no rhonchi. She has no rales.  Lymphadenopathy:       Head (right side): No submental, no submandibular, no tonsillar, no preauricular, no posterior auricular and no occipital adenopathy present.       Head (left side): No submental, no submandibular, no tonsillar, no preauricular, no posterior auricular and no occipital adenopathy present.    She has no cervical adenopathy.  Neurological: She is alert.  Skin: Skin is warm and dry.  Psychiatric: She has a normal mood and affect. Her speech is normal and behavior is normal. Thought content normal.  Vitals reviewed.      Assessment & Plan:   Problem List Items Addressed This Visit      Other   Attention deficit hyperactivity disorder (ADHD)    Advised patient  to follow up with PCP for discussion regarding ADHD and dexedrine. I have also sent a note to PCP's CMA regarding refilling this medication.       Cough    Chronic in nature. Unchanged. Suspect exacerbating triggers that include pet dander, smoking marijuana. Discussed with patient at great length most important aspect for suspected asthma is to control triggers. Advised her to stop smoking, and to limit pet sleeping in places such as the bedroom. We will try daily antihistamine, Flonase, nasal saline spray.  If no improvement with conservative therapy, advised consult pulmonology for formal diagnosis. Also considering allergy consult. Return precautions given.            I have discontinued Ms. Nulty's ranitidine. I am also having her maintain her Dapsone, MELATONIN ER PO, TAZORAC, cetirizine, drospirenone-ethinyl estradiol, ALPRAZolam, sertraline, beclomethasone, traZODone, and meloxicam. We will continue to  administer betamethasone acetate-betamethasone sodium phosphate, betamethasone acetate-betamethasone sodium phosphate, betamethasone acetate-betamethasone sodium phosphate, and betamethasone acetate-betamethasone sodium phosphate.   No orders of the defined types were placed in this encounter.   Return precautions given.   Risks, benefits, and alternatives of the medications and treatment plan prescribed today were discussed, and patient expressed understanding.   Education regarding symptom management and diagnosis given to patient on AVS.  Continue to follow with Glori Luis, MD for routine health maintenance.   [name] and I agreed with plan.   Rennie Plowman, FNP

## 2017-07-28 ENCOUNTER — Encounter: Payer: Self-pay | Admitting: Family

## 2017-07-28 DIAGNOSIS — R059 Cough, unspecified: Secondary | ICD-10-CM | POA: Insufficient documentation

## 2017-07-28 DIAGNOSIS — R05 Cough: Secondary | ICD-10-CM | POA: Insufficient documentation

## 2017-07-28 NOTE — Assessment & Plan Note (Signed)
Advised patient to follow up with PCP for discussion regarding ADHD and dexedrine. I have also sent a note to PCP's CMA regarding refilling this medication.

## 2017-07-28 NOTE — Assessment & Plan Note (Signed)
Chronic in nature. Unchanged. Suspect exacerbating triggers that include pet dander, smoking marijuana. Discussed with patient at great length most important aspect for suspected asthma is to control triggers. Advised her to stop smoking, and to limit pet sleeping in places such as the bedroom. We will try daily antihistamine, Flonase, nasal saline spray.  If no improvement with conservative therapy, advised consult pulmonology for formal diagnosis. Also considering allergy consult. Return precautions given.

## 2017-09-12 ENCOUNTER — Encounter: Payer: Self-pay | Admitting: Family Medicine

## 2017-09-12 MED ORDER — SERTRALINE HCL 25 MG PO TABS: 75.0000 mg | ORAL_TABLET | Freq: Every day | ORAL | 0 refills | Status: DC

## 2017-09-12 NOTE — Addendum Note (Signed)
Addended by: Inetta FermoHENDRICKS, JESSICA S on: 09/12/2017 11:05 AM   Modules accepted: Orders

## 2017-09-19 ENCOUNTER — Other Ambulatory Visit: Payer: Self-pay | Admitting: Podiatry

## 2017-09-19 NOTE — Telephone Encounter (Signed)
Pt needs an appt prior to future refills. 

## 2017-11-09 ENCOUNTER — Encounter: Payer: Self-pay | Admitting: Family Medicine

## 2017-11-09 ENCOUNTER — Ambulatory Visit (INDEPENDENT_AMBULATORY_CARE_PROVIDER_SITE_OTHER): Payer: Managed Care, Other (non HMO) | Admitting: Family Medicine

## 2017-11-09 ENCOUNTER — Other Ambulatory Visit: Payer: Self-pay

## 2017-11-09 VITALS — BP 124/80 | HR 84 | Temp 98.4°F | Wt 219.2 lb

## 2017-11-09 DIAGNOSIS — E538 Deficiency of other specified B group vitamins: Secondary | ICD-10-CM

## 2017-11-09 DIAGNOSIS — Z13220 Encounter for screening for lipoid disorders: Secondary | ICD-10-CM | POA: Diagnosis not present

## 2017-11-09 DIAGNOSIS — R87619 Unspecified abnormal cytological findings in specimens from cervix uteri: Secondary | ICD-10-CM | POA: Diagnosis not present

## 2017-11-09 DIAGNOSIS — J4520 Mild intermittent asthma, uncomplicated: Secondary | ICD-10-CM

## 2017-11-09 DIAGNOSIS — F320 Major depressive disorder, single episode, mild: Secondary | ICD-10-CM | POA: Diagnosis not present

## 2017-11-09 DIAGNOSIS — L989 Disorder of the skin and subcutaneous tissue, unspecified: Secondary | ICD-10-CM | POA: Diagnosis not present

## 2017-11-09 DIAGNOSIS — F909 Attention-deficit hyperactivity disorder, unspecified type: Secondary | ICD-10-CM

## 2017-11-09 DIAGNOSIS — D72829 Elevated white blood cell count, unspecified: Secondary | ICD-10-CM | POA: Diagnosis not present

## 2017-11-09 DIAGNOSIS — J45909 Unspecified asthma, uncomplicated: Secondary | ICD-10-CM | POA: Insufficient documentation

## 2017-11-09 MED ORDER — BECLOMETHASONE DIPROP HFA 80 MCG/ACT IN AERB: 1.0000 | INHALATION_SPRAY | Freq: Two times a day (BID) | RESPIRATORY_TRACT | 2 refills | Status: DC

## 2017-11-09 MED ORDER — SERTRALINE HCL 100 MG PO TABS: 100.0000 mg | ORAL_TABLET | Freq: Every day | ORAL | 3 refills | Status: DC

## 2017-11-09 MED ORDER — LISDEXAMFETAMINE DIMESYLATE 30 MG PO CAPS: 30.0000 mg | ORAL_CAPSULE | Freq: Every day | ORAL | 0 refills | Status: DC

## 2017-11-09 NOTE — Patient Instructions (Signed)
Nice to see you. We are going to go up on your Zoloft. We will increase your Qvar. We will restart you on Vyvanse 30 mg.  If you have any adverse effects from this please let us know. We will get you to see gynecology. Please set up a lab appointment for fasting lab work as well.

## 2017-11-09 NOTE — Assessment & Plan Note (Addendum)
Check B12 

## 2017-11-09 NOTE — Assessment & Plan Note (Signed)
Needs to work on diet and exercise.

## 2017-11-09 NOTE — Assessment & Plan Note (Signed)
Zoloft has been helpful though does continue to have some symptoms.  We will increase to 100 mg of Zoloft daily.

## 2017-11-09 NOTE — Assessment & Plan Note (Signed)
Minimal elevation previously.  Repeat today.

## 2017-11-09 NOTE — Progress Notes (Signed)
Tommi Rumps, MD Phone: [phone]  Kristin Mccarthy is a 42 y.o. female who presents today for follow-up.  ADHD: She has intermittently been taking Dexedrine alternating with Vyvanse.  She does not take them the same day or even within the same week.  She was on Vyvanse previously for ADHD and binge eating.  She has not been on either of them recently given that she has run out of medications.  No palpitations when she was taking them.  History of B12 deficiency: On oral supplement.  Needs recheck.  She reports a history of an abnormal Pap smear.  Did not have this followed up on.  Defers exam today and would like to see gynecology.  Most recent Pap smear in our system was negative for abnormal cells.  Patient likely has asthma based on history.  Intermittent cough and wheezing.  Qvar has been significantly helpful though she does still have some symptoms.  She has been using nasal saline and Zyrtec as well.  Depression: No SI.  Currently on Zoloft 75 mg daily.  Notes this is better though does still have some depressive symptoms.  She is interested in increasing the dose.  She reports a bump that came up on her left anterior shoulder several weeks ago.  She scratched at it and it came off.  There is a small scab there currently.  No surrounding erythema on exam.  No tenderness on exam.  Scab present.  Social History   Tobacco Use  Smoking Status Former Smoker  . Packs/day: 1.00  . Last attempt to quit: 11/24/2008  . Years since quitting: 8.9  Smokeless Tobacco Never Used     ROS see history of present illness  Objective  Physical Exam Vitals:   11/09/17 1402  BP: 124/80  Pulse: 84  Temp: 98.4 F (36.9 C)  SpO2: 98%    BP Readings from Last 3 Encounters:  11/09/17 124/80  07/27/17 132/80  10/05/16 138/86   Wt Readings from Last 3 Encounters:  11/09/17 219 lb 3.2 oz (99.4 kg)  07/27/17 222 lb 3.2 oz (100.8 kg)  10/05/16 216 lb 6.4 oz (98.2 kg)    Physical  Exam  Constitutional: No distress.  Cardiovascular: Normal rate, regular rhythm and normal heart sounds.  Pulmonary/Chest: Effort normal and breath sounds normal.  Musculoskeletal: She exhibits no edema.  Neurological: She is alert. Gait normal.  Skin: Skin is warm and dry. She is not diaphoretic.     Assessment/Plan: Please see individual problem list.  Attention deficit hyperactivity disorder (ADHD) Trial back on Vyvanse 30 mg.  1 month given to her.  She will follow-up at that time.  B12 deficiency Check B12.  Asthma History of asthma.  No PFTs completed previously.  Has done well with Qvar.  She would like to defer any further evaluation for this.  Given minimal persistent symptoms we will increase her Qvar.  Depression, major, single episode, mild ([place]) Zoloft has been helpful though does continue to have some symptoms.  We will increase to 100 mg of Zoloft daily.  Abnormal cervical Papanicolaou smear Patient reports a history of this in the past.  Most recent Pap smear is normal in our system though she may have had one since then that was abnormal.  She reports she was supposed to return after 1 year.  She declined exam today.  Will refer to gynecology.  Leukocytosis Minimal elevation previously.  Repeat today.  Morbid obesity ([place]) Needs to work on diet and exercise.  Skin lesion Scab noted currently.  She will monitor.  If it does not heal or if it recurs she will let us know.   Dula was seen today for follow-up.  Diagnoses and all orders for this visit:  B12 deficiency -     Vitamin B12; Future  Lipid screening -     Lipid panel; Future -     Comp Met (CMET); Future  Leukocytosis, unspecified type -     CBC w/Diff; Future  Morbid obesity (HCC) -     Hemoglobin A1c; Future  Abnormal cervical Papanicolaou smear, unspecified abnormal pap finding -     Ambulatory referral to Gynecology  Attention deficit hyperactivity disorder (ADHD), unspecified  ADHD type  Mild intermittent asthma without complication  Depression, major, single episode, mild (HCC)  Skin lesion  Other orders -     lisdexamfetamine (VYVANSE) 30 MG capsule; Take 1 capsule (30 mg total) by mouth daily. -     sertraline (ZOLOFT) 100 MG tablet; Take 1 tablet (100 mg total) by mouth daily. -     beclomethasone (QVAR REDIHALER) 80 MCG/ACT inhaler; Inhale 1 puff into the lungs 2 (two) times daily.    Orders Placed This Encounter  Procedures  . Lipid panel    Standing Status:   Future    Standing Expiration Date:   11/09/2018  . Comp Met (CMET)    Standing Status:   Future    Standing Expiration Date:   11/09/2018  . Vitamin B12    Standing Status:   Future    Standing Expiration Date:   11/09/2018  . CBC w/Diff    Standing Status:   Future    Standing Expiration Date:   11/09/2018  . Hemoglobin A1c    Standing Status:   Future    Standing Expiration Date:   11/09/2018  . Ambulatory referral to Gynecology    Referral Priority:   Routine    Referral Type:   Consultation    Referral Reason:   Specialty Services Required    Requested Specialty:   Gynecology    Number of Visits Requested:   1    Meds ordered this encounter  Medications  . lisdexamfetamine (VYVANSE) 30 MG capsule    Sig: Take 1 capsule (30 mg total) by mouth daily.    Dispense:  30 capsule    Refill:  0  . sertraline (ZOLOFT) 100 MG tablet    Sig: Take 1 tablet (100 mg total) by mouth daily.    Dispense:  90 tablet    Refill:  3  . beclomethasone (QVAR REDIHALER) 80 MCG/ACT inhaler    Sig: Inhale 1 puff into the lungs 2 (two) times daily.    Dispense:  1 Inhaler    Refill:  Davie, MD Ida

## 2017-11-09 NOTE — Assessment & Plan Note (Signed)
History of asthma.  No PFTs completed previously.  Has done well with Qvar.  She would like to defer any further evaluation for this.  Given minimal persistent symptoms we will increase her Qvar.

## 2017-11-09 NOTE — Assessment & Plan Note (Signed)
Scab noted currently.  She will monitor.  If it does not heal or if it recurs she will let us know.

## 2017-11-09 NOTE — Assessment & Plan Note (Signed)
Trial back on Vyvanse 30 mg.  1 month given to her.  She will follow-up at that time.

## 2017-11-09 NOTE — Assessment & Plan Note (Signed)
Patient reports a history of this in the past.  Most recent Pap smear is normal in our system though she may have had one since then that was abnormal.  She reports she was supposed to return after 1 year.  She declined exam today.  Will refer to gynecology.

## 2017-11-10 ENCOUNTER — Encounter: Payer: Self-pay | Admitting: Family Medicine

## 2017-11-11 NOTE — Addendum Note (Signed)
Addended by: Penne LashWIGGINS, Shawneen Deetz N on: 11/11/2017 04:53 PM   Modules accepted: Orders

## 2017-11-15 ENCOUNTER — Telehealth: Payer: Self-pay | Admitting: Obstetrics & Gynecology

## 2017-11-15 NOTE — Telephone Encounter (Signed)
[facility] referring for Abnormal cervical Papanicolaou smear, unspecified abnormal pap finding. Called and left voicemail for patient to call back to be schedule

## 2017-11-17 ENCOUNTER — Encounter: Payer: Self-pay | Admitting: Family Medicine

## 2017-11-17 NOTE — Telephone Encounter (Signed)
Called and left Voicemail for patient to call back to be schedule

## 2017-11-18 ENCOUNTER — Other Ambulatory Visit: Payer: Self-pay | Admitting: Family Medicine

## 2017-11-18 ENCOUNTER — Other Ambulatory Visit (INDEPENDENT_AMBULATORY_CARE_PROVIDER_SITE_OTHER): Payer: Managed Care, Other (non HMO)

## 2017-11-18 DIAGNOSIS — D72829 Elevated white blood cell count, unspecified: Secondary | ICD-10-CM

## 2017-11-18 DIAGNOSIS — Z13220 Encounter for screening for lipoid disorders: Secondary | ICD-10-CM

## 2017-11-18 DIAGNOSIS — E538 Deficiency of other specified B group vitamins: Secondary | ICD-10-CM

## 2017-11-18 MED ORDER — DEXTROAMPHETAMINE SULFATE ER 5 MG PO CP24: 5.0000 mg | ORAL_CAPSULE | Freq: Every day | ORAL | 0 refills | Status: DC

## 2017-11-19 NOTE — Progress Notes (Signed)
The 10-year ASCVD risk score Denman George(Goff DC Montez HagemanJr., et al., 2013) is: 1.3%   Values used to calculate the score:     Age: [age] years     Sex: Female     Is Non-Hispanic African American: No     Diabetic: No     Tobacco smoker: No     Systolic Blood Pressure: 124 mmHg     Is BP treated: No     HDL Cholesterol: 45 mg/dL     Total Cholesterol: 237 mg/dL

## 2017-11-20 LAB — LIPID PANEL
Chol/HDL Ratio: 5.3 ratio — ABNORMAL HIGH (ref 0.0–4.4)
Cholesterol, Total: 237 mg/dL — ABNORMAL HIGH (ref 100–199)
HDL: 45 mg/dL (ref >39)
LDL Calculated: 157 mg/dL — ABNORMAL HIGH (ref 0–99)
TRIGLYCERIDES: 177 mg/dL — AB (ref 0–149)
VLDL CHOLESTEROL CAL: 35 mg/dL (ref 5–40)

## 2017-11-20 LAB — VITAMIN B12: Vitamin B-12: 317 pg/mL (ref 232–1245)

## 2017-11-20 LAB — COMPREHENSIVE METABOLIC PANEL
A/G RATIO: 1.5 (ref 1.2–2.2)
ALBUMIN: 4.5 g/dL (ref 3.5–5.5)
ALT: 12 IU/L (ref 0–32)
AST: 12 IU/L (ref 0–40)
Alkaline Phosphatase: 108 IU/L (ref 39–117)
BUN/Creatinine Ratio: 17 (ref 9–23)
BUN: 12 mg/dL (ref 6–24)
Bilirubin Total: <0.2 mg/dL (ref 0.0–1.2)
CALCIUM: 9.6 mg/dL (ref 8.7–10.2)
CO2: 22 mmol/L (ref 20–29)
Chloride: 101 mmol/L (ref 96–106)
Creatinine, Ser: 0.69 mg/dL (ref 0.57–1.00)
GFR, EST AFRICAN AMERICAN: 123 mL/min/{1.73_m2} (ref >59)
GFR, EST NON AFRICAN AMERICAN: 107 mL/min/{1.73_m2} (ref >59)
GLOBULIN, TOTAL: 3 g/dL (ref 1.5–4.5)
Glucose: 93 mg/dL (ref 65–99)
POTASSIUM: 4.5 mmol/L (ref 3.5–5.2)
SODIUM: 138 mmol/L (ref 134–144)
TOTAL PROTEIN: 7.5 g/dL (ref 6.0–8.5)

## 2017-11-20 LAB — CBC WITH DIFFERENTIAL/PLATELET
BASOS: 0 %
Basophils Absolute: 0 10*3/uL (ref 0.0–0.2)
EOS (ABSOLUTE): 0.2 10*3/uL (ref 0.0–0.4)
Eos: 1 %
HEMATOCRIT: 38.5 % (ref 34.0–46.6)
Hemoglobin: 12.9 g/dL (ref 11.1–15.9)
Immature Grans (Abs): 0.1 10*3/uL (ref 0.0–0.1)
Immature Granulocytes: 1 %
LYMPHS ABS: 5.4 10*3/uL — AB (ref 0.7–3.1)
Lymphs: 29 %
MCH: 27.7 pg (ref 26.6–33.0)
MCHC: 33.5 g/dL (ref 31.5–35.7)
MCV: 83 fL (ref 79–97)
MONOS ABS: 0.9 10*3/uL (ref 0.1–0.9)
Monocytes: 5 %
NEUTROS ABS: 11.8 10*3/uL — AB (ref 1.4–7.0)
Neutrophils: 64 %
Platelets: 373 10*3/uL (ref 150–379)
RBC: 4.65 x10E6/uL (ref 3.77–5.28)
RDW: 14.9 % (ref 12.3–15.4)
WBC: 18.6 10*3/uL — AB (ref 3.4–10.8)

## 2017-11-20 LAB — HEMOGLOBIN A1C
Est. average glucose Bld gHb Est-mCnc: 114 mg/dL
Hgb A1c MFr Bld: 5.6 % (ref 4.8–5.6)

## 2017-11-24 ENCOUNTER — Encounter: Payer: Self-pay | Admitting: Family Medicine

## 2017-11-24 ENCOUNTER — Other Ambulatory Visit: Payer: Self-pay | Admitting: Family Medicine

## 2017-11-24 DIAGNOSIS — D72829 Elevated white blood cell count, unspecified: Secondary | ICD-10-CM

## 2017-11-28 ENCOUNTER — Encounter: Payer: 59 | Admitting: Obstetrics and Gynecology

## 2017-11-30 ENCOUNTER — Other Ambulatory Visit (INDEPENDENT_AMBULATORY_CARE_PROVIDER_SITE_OTHER): Payer: Managed Care, Other (non HMO)

## 2017-11-30 DIAGNOSIS — D72829 Elevated white blood cell count, unspecified: Secondary | ICD-10-CM | POA: Diagnosis not present

## 2017-11-30 NOTE — Addendum Note (Signed)
Addended by: Warden FillersWRIGHT, Quamesha Mullet S on: 11/30/2017 03:02 PM   Modules accepted: Orders

## 2017-12-01 ENCOUNTER — Ambulatory Visit (INDEPENDENT_AMBULATORY_CARE_PROVIDER_SITE_OTHER): Payer: 59 | Admitting: Obstetrics and Gynecology

## 2017-12-01 ENCOUNTER — Encounter: Payer: Self-pay | Admitting: Obstetrics and Gynecology

## 2017-12-01 VITALS — BP 128/88 | HR 89 | Ht 62.0 in | Wt 220.0 lb

## 2017-12-01 DIAGNOSIS — Z124 Encounter for screening for malignant neoplasm of cervix: Secondary | ICD-10-CM

## 2017-12-01 DIAGNOSIS — Z1231 Encounter for screening mammogram for malignant neoplasm of breast: Secondary | ICD-10-CM | POA: Diagnosis not present

## 2017-12-01 DIAGNOSIS — N912 Amenorrhea, unspecified: Secondary | ICD-10-CM

## 2017-12-01 DIAGNOSIS — Z308 Encounter for other contraceptive management: Secondary | ICD-10-CM

## 2017-12-01 DIAGNOSIS — Z1239 Encounter for other screening for malignant neoplasm of breast: Secondary | ICD-10-CM

## 2017-12-01 LAB — CBC WITH DIFFERENTIAL/PLATELET
BASOS: 0 %
Basophils Absolute: 0 10*3/uL (ref 0.0–0.2)
EOS (ABSOLUTE): 0.3 10*3/uL (ref 0.0–0.4)
EOS: 2 %
Hematocrit: 34.9 % (ref 34.0–46.6)
Hemoglobin: 11.8 g/dL (ref 11.1–15.9)
IMMATURE GRANS (ABS): 0 10*3/uL (ref 0.0–0.1)
IMMATURE GRANULOCYTES: 0 %
LYMPHS: 34 %
Lymphocytes Absolute: 5.5 10*3/uL — ABNORMAL HIGH (ref 0.7–3.1)
MCH: 27.3 pg (ref 26.6–33.0)
MCHC: 33.8 g/dL (ref 31.5–35.7)
MCV: 81 fL (ref 79–97)
Monocytes Absolute: 0.9 10*3/uL (ref 0.1–0.9)
Monocytes: 5 %
NEUTROS PCT: 59 %
Neutrophils Absolute: 9.4 10*3/uL — ABNORMAL HIGH (ref 1.4–7.0)
PLATELETS: 397 10*3/uL — AB (ref 150–379)
RBC: 4.32 x10E6/uL (ref 3.77–5.28)
RDW: 15 % (ref 12.3–15.4)
WBC: 16.1 10*3/uL — AB (ref 3.4–10.8)

## 2017-12-01 MED ORDER — LEVONORGESTREL-ETHINYL ESTRAD 0.1-20 MG-MCG PO TABS: 1.0000 | ORAL_TABLET | Freq: Every day | ORAL | 11 refills | Status: AC

## 2017-12-01 NOTE — Progress Notes (Signed)
Gynecology Annual Exam  PCP: Glori Luis, MD  Chief Complaint:  Chief Complaint  Patient presents with  . Abnormal Pap Smear    History of Present Illness: Patient is a 42 y.o. G2P0020 presents for annual exam.    Patient is here today to follow up on a pap smear she had several years ago. She was told she needed to follow up in a year, but she neglected her healthcare and did not.  Patient has not had intercourse in many years and is not comfortable with exams. She reports today that she is high from marijuana and took a xanax before coming so she could tolerate the exam.   Patient has been taking extended phase birth control to help with a history of ovarian cysts. She would like to continue this medical management of her periods.   LMP: Patient's last menstrual period was 09/01/2017. Average Interval: irregular, not applicable days Duration of flow: 5 days Heavy Menses: no Clots: no Intermenstrual Bleeding: no Postcoital Bleeding: not applicable Dysmenorrhea: no   The patient is not currently sexually active. She currently uses OCP (estrogen/progesterone) for contraception. She denies dyspareunia.  The patient does perform self breast exams.  There is no notable family history of breast or ovarian cancer in her family.  The patient wears seatbelts: yes.   The patient has regular exercise: not asked.    The patient reports current symptoms of depression.  She manages this with her family physician Dr. Birdie Sons.  Review of Systems: Review of Systems  Constitutional: Negative for chills, fever, malaise/fatigue and weight loss.  HENT: Negative for congestion, hearing loss and sinus pain.   Eyes: Negative for blurred vision and double vision.  Respiratory: Negative for cough, sputum production, shortness of breath and wheezing.   Cardiovascular: Negative for chest pain, palpitations, orthopnea and leg swelling.  Gastrointestinal: Negative for abdominal pain,  constipation, diarrhea, nausea and vomiting.  Genitourinary: Negative for dysuria, flank pain, frequency, hematuria and urgency.  Musculoskeletal: Negative for back pain, falls and joint pain.  Skin: Negative for itching and rash.  Neurological: Negative for dizziness and headaches.  Psychiatric/Behavioral: Negative for depression, substance abuse and suicidal ideas. The patient is not nervous/anxious.     Past Medical History:  Past Medical History:  Diagnosis Date  . Acne    Followed by Vaughan Sine  . Gallstones   . GERD (gastroesophageal reflux disease)   . Ovarian cyst     Past Surgical History:  Past Surgical History:  Procedure Laterality Date  . APPENDECTOMY  1997  . BREAST LUMPECTOMY Right 2010   Benign    Gynecologic History:  Patient's last menstrual period was 09/01/2017. Contraception: OCP (estrogen/progesterone) Last Pap: Results were: 2014 ASCUS with NEGATIVE high risk HPV  Last mammogram: 2011 Results were: BI-RAD I Obstetric History: G2P0020  Family History:  Family History  Problem Relation Age of Onset  . Hypertension Mother   . Hyperlipidemia Father   . Hypertension Father   . Diabetes Father        Prediabetes  . Heart disease Father   . Heart disease Paternal Uncle   . Heart disease Paternal Grandmother   . Heart disease Paternal Grandfather     Social History:  Social History   Socioeconomic History  . Marital status: Single    Spouse name: Not on file  . Number of children: 0  . Years of education: 79  . Highest education level: Not on file  Social Needs  .  Financial resource strain: Not on file  . Food insecurity - worry: Not on file  . Food insecurity - inability: Not on file  . Transportation needs - medical: Not on file  . Transportation needs - non-medical: Not on file  Occupational History  . Occupation: IT    Comment: [employer]  Tobacco Use  . Smoking status: Former Smoker    Packs/day: 1.00    Last attempt to quit:  11/24/2008    Years since quitting: 9.0  . Smokeless tobacco: Never Used  Substance and Sexual Activity  . Alcohol use: Yes  . Drug use: Yes    Types: Marijuana  . Sexual activity: Not Currently    Birth control/protection: Pill  Other Topics Concern  . Not on file  Social History Narrative   [name] grew up in [place]. She attended [employer] and obtained her Associates degree. Then she attended [employer] in [place] and obtained her [employer]achel[employer] then obtained a Child psychotherapistMaster in Production assistant, radionformation Systems Management from [employer]. She lives at home with her two dogs. She enjoys Financial risk analysttechnology Public librarian(tech junky). She does Primary school teachergraphic design.     Allergies:  No Known Allergies  Medications: Prior to Admission medications   Medication Sig Start Date End Date Taking? Authorizing Provider  ALPRAZolam Prudy Feeler(XANAX) 0.5 MG tablet take 1 tablet by mouth twice a day if needed for anxiety 03/16/17  Yes Glori LuisSonnenberg, Eric G, MD  beclomethasone (QVAR REDIHALER) 80 MCG/ACT inhaler Inhale 1 puff into the lungs 2 (two) times daily. 11/09/17  Yes Glori LuisSonnenberg, Eric G, MD  Dapsone (ACZONE) 5 % topical gel Apply topically 2 (two) times daily.   Yes [provider]  dextroamphetamine (DEXEDRINE) 5 MG 24 hr capsule Take 1 capsule (5 mg total) by mouth daily. 11/18/17  Yes Glori LuisSonnenberg, Eric G, MD  drospirenone-ethinyl estradiol Pierre Bali(YAZ,GIANVI,LORYNA) 3-0.02 MG tablet take 1 tablet by mouth daily 09/07/16  Yes Glori LuisSonnenberg, Eric G, MD  MELATONIN ER PO Take by mouth.   Yes [provider]  meloxicam (MOBIC) 15 MG tablet TAKE 1 TABLET DAILY 09/19/17  Yes Park LiterPrice, Michael J, DPM  sertraline (ZOLOFT) 100 MG tablet Take 1 tablet (100 mg total) by mouth daily. 11/09/17  Yes Glori LuisSonnenberg, Eric G, MD  TAZORAC 0.1 % cream  12/02/13  Yes [provider]  traZODone (DESYREL) 50 MG tablet TAKE 1 TABLET AT BEDTIME AS NEEDED FOR SLEEP 05/18/17  Yes Glori LuisSonnenberg, Eric G, MD  cetirizine (ZYRTEC) 10 MG  tablet Take 10 mg by mouth daily.    [provider]  levonorgestrel-ethinyl estradiol (AVIANE) 0.1-20 MG-MCG tablet Take 1 tablet by mouth daily. 12/01/17   Natale MilchSchuman, Christanna R, MD    Physical Exam Vitals: Blood pressure 128/88, pulse 89, height 5\' 2"  (1.575 m), weight 220 lb (99.8 kg), last menstrual period 09/01/2017.  Physical Exam  Constitutional: She is oriented to person, place, and time. She appears well-developed.  Genitourinary: Vagina normal and uterus normal. There is no lesion on the right labia. There is no lesion on the left labia. Vagina exhibits no lesion. Right adnexum does not display mass. Left adnexum does not display mass. Cervix does not exhibit motion tenderness.  Genitourinary Comments: Limited by intolerance to bimanual exam  HENT:  Head: Normocephalic and atraumatic.  Eyes: EOM are normal.  Neck: Neck supple. No thyromegaly present.  Cardiovascular: Normal rate, regular rhythm and normal heart sounds.  Pulmonary/Chest: Effort normal and breath sounds normal. Right breast exhibits no inverted nipple, no  mass, no nipple discharge and no skin change. Left breast exhibits no inverted nipple, no mass, no nipple discharge and no skin change.  Abdominal: Soft. Bowel sounds are normal. She exhibits no distension and no mass.  Neurological: She is alert and oriented to person, place, and time.  Skin: Skin is warm and dry.  Psychiatric: She has a normal mood and affect. Her behavior is normal. Judgment and thought content normal.  Vitals reviewed.  Female chaperone present for pelvic and breast  portions of the physical exam  Assessment: 42 y.o. G2P0020 routine annual exam  Plan: Problem List Items Addressed This Visit    None    Visit Diagnoses    Amenorrhea    -  Primary   Relevant Orders   POCT urine pregnancy   Screening for cervical cancer       Relevant Orders   PapIG, CtNgTv, HPV, rfx 16/18   Screening for breast cancer       Relevant Orders    MM DIGITAL SCREENING BILATERAL   Encounter for other contraceptive management       Relevant Medications   levonorgestrel-ethinyl estradiol (AVIANE) 0.1-20 MG-MCG tablet      1) Mammogram - recommend yearly screening mammogram.  Mammogram Was ordered today  2) STI screening was offered and declined. Urine pregnancy test was declined.  3) ASCCP guidelines and rational discussed.  Pap smear performed today  4) Contraception - OCP reordered  5) Routine healthcare maintenance including cholesterol, diabetes screening discussed managed by PCP

## 2017-12-02 ENCOUNTER — Ambulatory Visit: Payer: 59 | Admitting: Podiatry

## 2017-12-06 ENCOUNTER — Encounter: Payer: Self-pay | Admitting: Family Medicine

## 2017-12-06 ENCOUNTER — Ambulatory Visit (INDEPENDENT_AMBULATORY_CARE_PROVIDER_SITE_OTHER): Payer: 59 | Admitting: Podiatry

## 2017-12-06 ENCOUNTER — Encounter: Payer: Self-pay | Admitting: Podiatry

## 2017-12-06 DIAGNOSIS — M7671 Peroneal tendinitis, right leg: Secondary | ICD-10-CM | POA: Diagnosis not present

## 2017-12-06 DIAGNOSIS — D72829 Elevated white blood cell count, unspecified: Secondary | ICD-10-CM

## 2017-12-06 DIAGNOSIS — M7672 Peroneal tendinitis, left leg: Secondary | ICD-10-CM

## 2017-12-06 LAB — PAPIG, CTNGTV, HPV, RFX 16/18
Chlamydia, Nuc. Acid Amp: NEGATIVE
Gonococcus, Nuc. Acid Amp: NEGATIVE
HPV, high-risk: NEGATIVE
PAP Smear Comment: 0
Trich vag by NAA: NEGATIVE

## 2017-12-06 MED ORDER — DICLOFENAC SODIUM 75 MG PO TBEC: 75.0000 mg | DELAYED_RELEASE_TABLET | Freq: Two times a day (BID) | ORAL | 1 refills | Status: AC

## 2017-12-06 MED ORDER — METHYLPREDNISOLONE 4 MG PO TBPK: ORAL_TABLET | ORAL | 0 refills | Status: DC

## 2017-12-06 NOTE — Progress Notes (Signed)
Please call patient and let her know that pap smear is normal. Next pap needed in 5 years. Thank you! -Dr. Jerene PitchSchuman

## 2017-12-06 NOTE — Progress Notes (Signed)
Released to mychart

## 2017-12-08 NOTE — Progress Notes (Signed)
   Subjective: Patient presents today for follow-up treatment and evaluation of bilateral insertional peroneal tendinitis. She reports she is having continued pain and is requesting injections to help alleviate the symptoms. She states the pain is constant but is owrse in the morning. She states the injections have helped alleviate the pain in the past. Patient is here for further evaluation and treatment.   Past Medical History:  Diagnosis Date  . Acne    Followed by Vaughan Sineerm  . Gallstones   . GERD (gastroesophageal reflux disease)   . Ovarian cyst      Objective/Physical Exam General: The patient is alert and oriented x3 in no acute distress.  Dermatology: Skin is warm, dry and supple bilateral lower extremities. Negative for open lesions or macerations.  Vascular: Palpable pedal pulses bilaterally. No edema or erythema noted. Capillary refill within normal limits.  Neurological: Epicritic and protective threshold grossly intact bilaterally.   Musculoskeletal Exam: Pain with palpation to peroneal tendons bilaterally. Range of motion within normal limits to all pedal and ankle joints bilateral. Muscle strength 5/5 in all groups bilateral.    Assessment: #1 Insertional peroneal tendinitis bilateral   Plan of Care:  #1 Patient was evaluated. #2 injection of 0.5 mL Celestone Soluspan injected in the peroneal tendon sheath bilaterally.  #3 Prescription for Medrol Dose Pak provided to patient.  #4 Prescription for Diclofenac provided to patient.  #5 Recommended wearing CAM boot on the left foot. #6 Ankle brace dispensed to wear on the right. #7 Return to clinic in 4 weeks.   Felecia ShellingBrent M. Lindzy Rupert, DPM [facility]  Dr. Felecia ShellingBrent M. Aryianna Earwood, DPM    [address]                                        [place], [place] [address]                Office [phone]  Fax [phone]

## 2017-12-09 NOTE — Progress Notes (Signed)
[place]  [phone] [phone]  ID: Kristin Mccarthy OB: Nov 17, 1975  MR#: [id number]  BOF#:[id number]  Patient Care Team: Leone Haven, MD as PCP - General (Family Medicine)  CHIEF COMPLAINT: Leukocytosis  INTERVAL HISTORY: Patient is a 42 year old female who was noted to have a persistently elevated white blood cell count on routine blood work.  She is anxious, but otherwise feels well.  She has no neurologic complaints.  She denies any recent fevers or illnesses.  She denies any night sweats or weight loss.  She has no chest pain, shortness of breath, cough, or hemoptysis.  She denies any nausea, vomiting, constipation, or diarrhea.  She has no urinary complaints.  Patient otherwise feels well and offers no further specific complaints.  REVIEW OF SYSTEMS:   Review of Systems  Constitutional: Negative.  Negative for fever, malaise/fatigue and weight loss.  Respiratory: Negative.  Negative for cough, hemoptysis and shortness of breath.   Cardiovascular: Negative.  Negative for chest pain and leg swelling.  Gastrointestinal: Negative.  Negative for abdominal pain.  Genitourinary: Negative.  Negative for dysuria.  Musculoskeletal: Negative.   Skin: Negative.  Negative for rash.  Neurological: Negative.  Negative for weakness.  Psychiatric/Behavioral: The patient is nervous/anxious.     As per HPI. Otherwise, a complete review of systems is negative.  PAST MEDICAL HISTORY: Past Medical History:  Diagnosis Date  . Acne    Followed by Payton Mccallum  . Gallstones   . GERD (gastroesophageal reflux disease)   . Ovarian cyst     PAST SURGICAL HISTORY: Past Surgical History:  Procedure Laterality Date  . APPENDECTOMY  1997  . BREAST LUMPECTOMY Right 2010   Benign    FAMILY HISTORY: Family History  Problem Relation Age of Onset  . Hypertension Mother   . Hyperlipidemia Father   . Hypertension Father   . Diabetes Father    Prediabetes  . Heart disease Father   . Heart disease Paternal Uncle   . Heart disease Paternal Grandmother   . Heart disease Paternal Grandfather     ADVANCED DIRECTIVES (Y/N):  N  HEALTH MAINTENANCE: Social History   Tobacco Use  . Smoking status: Former Smoker    Packs/day: 1.00    Last attempt to quit: 11/24/2008    Years since quitting: 9.0  . Smokeless tobacco: Never Used  Substance Use Topics  . Alcohol use: Yes  . Drug use: Yes    Types: Marijuana     Colonoscopy:  PAP:  Bone density:  Lipid panel:  No Known Allergies  Current Outpatient Medications  Medication Sig Dispense Refill  . ALPRAZolam (XANAX) 0.5 MG tablet take 1 tablet by mouth twice a day if needed for anxiety 60 tablet 0  . beclomethasone (QVAR REDIHALER) 80 MCG/ACT inhaler Inhale 1 puff into the lungs 2 (two) times daily. 1 Inhaler 2  . cetirizine (ZYRTEC) 10 MG tablet Take 10 mg by mouth daily.    . Dapsone (ACZONE) 5 % topical gel Apply topically 2 (two) times daily.    Marland Kitchen dextroamphetamine (DEXEDRINE) 5 MG 24 hr capsule Take 1 capsule (5 mg total) by mouth daily. 30 capsule 0  . diclofenac (VOLTAREN) 75 MG EC tablet Take 1 tablet (75 mg total) by mouth 2 (two) times daily. 60 tablet 1  . drospirenone-ethinyl estradiol (YAZ,GIANVI,LORYNA) 3-0.02 MG tablet take 1 tablet by mouth daily 28 tablet 1  . levonorgestrel-ethinyl estradiol (AVIANE) 0.1-20 MG-MCG tablet Take 1 tablet by mouth daily. 1 Package 11  .  MELATONIN ER PO Take by mouth.    . meloxicam (MOBIC) 15 MG tablet TAKE 1 TABLET DAILY 30 tablet 0  . methylPREDNISolone (MEDROL DOSEPAK) 4 MG TBPK tablet 6 day dose pack - take as directed 21 tablet 0  . sertraline (ZOLOFT) 100 MG tablet Take 1 tablet (100 mg total) by mouth daily. 90 tablet 3  . TAZORAC 0.1 % cream     . traZODone (DESYREL) 50 MG tablet TAKE 1 TABLET AT BEDTIME AS NEEDED FOR SLEEP 30 tablet 1   Current Facility-Administered Medications  Medication Dose Route Frequency Provider  Last Rate Last Dose  . betamethasone acetate-betamethasone sodium phosphate (CELESTONE) injection 3 mg  3 mg Intramuscular Once Daylene Katayama M, DPM      . betamethasone acetate-betamethasone sodium phosphate (CELESTONE) injection 3 mg  3 mg Intramuscular Once Daylene Katayama M, DPM      . betamethasone acetate-betamethasone sodium phosphate (CELESTONE) injection 3 mg  3 mg Intramuscular Once Daylene Katayama M, DPM      . betamethasone acetate-betamethasone sodium phosphate (CELESTONE) injection 3 mg  3 mg Intramuscular Once Edrick Kins, DPM        OBJECTIVE: Vitals:   12/15/17 1334  BP: 135/83  Pulse: 74  Resp: 18  Temp: 97.8 F (36.6 C)     Body mass index is 40.06 kg/m.    ECOG FS:0 - Asymptomatic  General: Well-developed, well-nourished, no acute distress. Eyes: Pink conjunctiva, anicteric sclera. HEENT: Normocephalic, moist mucous membranes, clear oropharnyx. Lungs: Clear to auscultation bilaterally. Heart: Regular rate and rhythm. No rubs, murmurs, or gallops. Abdomen: Soft, nontender, nondistended. No organomegaly noted, normoactive bowel sounds. Musculoskeletal: No edema, cyanosis, or clubbing. Neuro: Alert, answering all questions appropriately. Cranial nerves grossly intact. Skin: No rashes or petechiae noted. Psych: Normal affect. Lymphatics: No cervical, calvicular, axillary or inguinal LAD.   LAB RESULTS:  Lab Results  Component Value Date   NA 138 11/18/2017   K 4.5 11/18/2017   CL 101 11/18/2017   CO2 22 11/18/2017   GLUCOSE 93 11/18/2017   BUN 12 11/18/2017   CREATININE 0.69 11/18/2017   CALCIUM 9.6 11/18/2017   PROT 7.5 11/18/2017   ALBUMIN 4.5 11/18/2017   AST 12 11/18/2017   ALT 12 11/18/2017   ALKPHOS 108 11/18/2017   BILITOT <0.2 11/18/2017   GFRNONAA 107 11/18/2017   GFRAA 123 11/18/2017    Lab Results  Component Value Date   WBC 16.1 (H) 11/30/2017   NEUTROABS 9.4 (H) 11/30/2017   HGB 11.8 11/30/2017   HCT 34.9 11/30/2017   MCV 81  11/30/2017   PLT 397 (H) 11/30/2017     STUDIES: No results found.  ASSESSMENT: Leukocytosis  PLAN:    1. Leukocytosis: Upon review of the chart, patient noted to have a mildly increased white blood cell count since December 2015.  Patient gets all of her laboratory work from [facility] which is pending at time of dictation.   in addition to repeat CBC, have also ordered BCR-ABL as well as peripheral blood flow cytometry to assess for underlying CML or CLL.  No intervention is needed at this time.  Patient does not require bone marrow biopsy.  Return to clinic in 3 months with repeat laboratory work and further evaluation.  Approximately 45 minutes was spent in discussion of which greater than 50% was consultation.  Patient expressed understanding and was in agreement with this plan. She also understands that She can call clinic at any time with any questions, concerns, or  complaints.    Lloyd Huger, MD   12/17/2017 9:48 AM

## 2017-12-11 ENCOUNTER — Encounter: Payer: Self-pay | Admitting: Podiatry

## 2017-12-14 ENCOUNTER — Ambulatory Visit: Payer: Managed Care, Other (non HMO) | Admitting: Family Medicine

## 2017-12-15 ENCOUNTER — Inpatient Hospital Stay: Payer: Managed Care, Other (non HMO) | Attending: Oncology | Admitting: Oncology

## 2017-12-15 ENCOUNTER — Encounter: Payer: Self-pay | Admitting: Oncology

## 2017-12-15 VITALS — BP 135/83 | HR 74 | Temp 97.8°F | Resp 18 | Wt 219.0 lb

## 2017-12-15 DIAGNOSIS — D72829 Elevated white blood cell count, unspecified: Secondary | ICD-10-CM | POA: Insufficient documentation

## 2017-12-17 ENCOUNTER — Encounter: Payer: Self-pay | Admitting: Oncology

## 2017-12-22 ENCOUNTER — Telehealth: Payer: Self-pay | Admitting: *Deleted

## 2017-12-22 NOTE — Telephone Encounter (Signed)
Patient called asking for lab results.They were drawn at [facility] last Friday. Please return her call.[phone].

## 2017-12-23 NOTE — Telephone Encounter (Signed)
Spoke to patient via telephone and relayed results; per Dr. Orlie DakinFinnegan, no evidence of leukemia or lymphoma. We will continue to monitor. Patient to follow up in 3 months with lab and see Dr. Orlie DakinFinnegan. Patient verbalized understanding.   dhs

## 2017-12-27 ENCOUNTER — Encounter: Payer: Self-pay | Admitting: Family Medicine

## 2017-12-28 ENCOUNTER — Encounter: Payer: Self-pay | Admitting: Oncology

## 2017-12-29 ENCOUNTER — Encounter: Payer: Self-pay | Admitting: Oncology

## 2017-12-30 ENCOUNTER — Encounter: Payer: Self-pay | Admitting: Family Medicine

## 2017-12-30 ENCOUNTER — Encounter: Payer: Self-pay | Admitting: Oncology

## 2018-01-03 ENCOUNTER — Encounter: Payer: Self-pay | Admitting: Podiatry

## 2018-01-03 ENCOUNTER — Ambulatory Visit (INDEPENDENT_AMBULATORY_CARE_PROVIDER_SITE_OTHER): Payer: 59 | Admitting: Podiatry

## 2018-01-03 ENCOUNTER — Telehealth: Payer: Self-pay

## 2018-01-03 DIAGNOSIS — M7671 Peroneal tendinitis, right leg: Secondary | ICD-10-CM

## 2018-01-03 DIAGNOSIS — M7672 Peroneal tendinitis, left leg: Secondary | ICD-10-CM

## 2018-01-03 NOTE — Telephone Encounter (Signed)
Please advise 

## 2018-01-03 NOTE — Telephone Encounter (Signed)
Please call  she has questions in regards to her appointment with hematologist .  He advised her to follow up with PCP and then follow up  Back up with hematologist in 3 months.   She sent via my chart blood work results . She is concerned in regards to WBC count being elevated .   Her insurance coverage isn't the best and concerned due to cost She is very anxious . Please advise.

## 2018-01-04 NOTE — Telephone Encounter (Signed)
Mychart message sent to patient.

## 2018-01-05 NOTE — Progress Notes (Signed)
   Subjective: Patient presents today for follow-up treatment and evaluation of bilateral insertional peroneal tendinitis. She reports that the pain has improved. She states the left foot has improved more than the right. The injection, taking Diclofenac and wearing the CAM boot have helped to alleviate the pain. Patient is here for further evaluation and treatment.   Past Medical History:  Diagnosis Date  . Acne    Followed by Vaughan Sine  . Gallstones   . GERD (gastroesophageal reflux disease)   . Ovarian cyst      Objective/Physical Exam General: The patient is alert and oriented x3 in no acute distress.  Dermatology: Skin is warm, dry and supple bilateral lower extremities. Negative for open lesions or macerations.  Vascular: Palpable pedal pulses bilaterally. No edema or erythema noted. Capillary refill within normal limits.  Neurological: Epicritic and protective threshold grossly intact bilaterally.   Musculoskeletal Exam: Pain with palpation to peroneal tendons bilaterally. Range of motion within normal limits to all pedal and ankle joints bilateral. Muscle strength 5/5 in all groups bilateral.    Assessment: #1 Insertional peroneal tendinitis bilateral - improving   Plan of Care:  #1 Patient was evaluated. #2 Continue wearing ankle braces to bilateral lower extremities.  #3 Slowly transition into compression anklets bilaterally over 2-4 weeks.  #4 Recommended good shoe gear.  #5 Slowly taper off of diclofenac over a two week period.  #6 Return to clinic as needed.    Felecia Shelling, DPM [facility]  Dr. Felecia Shelling, DPM    [address]                                        [place], [place] [address]                Office [phone]  Fax [phone]

## 2018-01-10 ENCOUNTER — Encounter: Payer: Self-pay | Admitting: Oncology

## 2018-01-30 ENCOUNTER — Encounter: Payer: Self-pay | Admitting: Family Medicine

## 2018-01-30 ENCOUNTER — Ambulatory Visit (INDEPENDENT_AMBULATORY_CARE_PROVIDER_SITE_OTHER): Payer: Managed Care, Other (non HMO) | Admitting: Family Medicine

## 2018-01-30 ENCOUNTER — Other Ambulatory Visit: Payer: Self-pay

## 2018-01-30 DIAGNOSIS — F909 Attention-deficit hyperactivity disorder, unspecified type: Secondary | ICD-10-CM | POA: Diagnosis not present

## 2018-01-30 DIAGNOSIS — H6122 Impacted cerumen, left ear: Secondary | ICD-10-CM

## 2018-01-30 DIAGNOSIS — D72829 Elevated white blood cell count, unspecified: Secondary | ICD-10-CM | POA: Diagnosis not present

## 2018-01-30 MED ORDER — DEXTROAMPHETAMINE SULFATE ER 10 MG PO CP24: 10.0000 mg | ORAL_CAPSULE | Freq: Every day | ORAL | 0 refills | Status: DC

## 2018-01-30 MED ORDER — DEXTROAMPHETAMINE SULFATE 5 MG PO TABS: ORAL_TABLET | ORAL | 0 refills | Status: DC

## 2018-01-30 NOTE — Patient Instructions (Signed)
Nice to see you. Please try the debrox over-the-counter for your ear.  If it is not improving please let us know. We will contact you when we hear back from Dr. Orlie DakinFinnegan. Please try the higher dose of the Dexedrine.  You can take the immediate release 5 mg tablet on the weekend.  Do not take this with the 10 mg extended release tablet.

## 2018-02-02 ENCOUNTER — Telehealth: Payer: Self-pay | Admitting: Family Medicine

## 2018-02-02 ENCOUNTER — Encounter: Payer: Self-pay | Admitting: Family Medicine

## 2018-02-02 DIAGNOSIS — H6120 Impacted cerumen, unspecified ear: Secondary | ICD-10-CM | POA: Insufficient documentation

## 2018-02-02 NOTE — Telephone Encounter (Signed)
-----   Message from Jeralyn Ruthsimothy J Finnegan, MD sent at 02/02/2018  3:22 PM EDT ----- No that sound good to me.  Would just check her cbc 1-2 times per year.  I'll cancel any f/u here.  Thanks Minerva AreolaEric!  ----- Message ----- From: Glori LuisSonnenberg, Lemar Bakos G, MD Sent: 02/02/2018   2:21 PM To: Jeralyn Ruthsimothy J Finnegan, MD  Clent RidgesHey Tim,   I saw Ms Hassell DoneHowland in the office this week for follow-up. Thanks for seeing her previously for her leukocytosis. It looks like the work up was pretty reassuring and she asked me if she could have her follow-up labs and future follow-up done through my office to limit her doctors visits and cost. I wanted to check with you to get your thoughts on her follow-up for a visit with you and for future labs. If you think she would be ok to follow in my office please let me know if there is anything she needs regarding lab work. Thanks for your help.  Minerva AreolaEric

## 2018-02-02 NOTE — Assessment & Plan Note (Signed)
Work up thus far has been unrevealing for cause. This will need to be monitored for now. We will message her hematologist to see if she can have monitoring through our office or if she needs to see them.

## 2018-02-02 NOTE — Telephone Encounter (Signed)
Please let the patient know that Dr Orlie DakinFinnegan noted it would be ok for her to follow-up with me for her elevated WBC. We will plan to check her CBC 1-2x/year. They are going to cancel her appointment. She should keep her follow-up with me.

## 2018-02-02 NOTE — Assessment & Plan Note (Signed)
Irrigated with improvement of cerumen. Still with slight muffled sensation. She can trial OTC debrox. If not improving she will let us know.

## 2018-02-02 NOTE — Progress Notes (Signed)
  Kristin AlarEric Sonnenberg, MD Phone: [phone]  Kristin CooperKimberly Mccarthy is a 42 y.o. female who presents today for f/u.  ADHD: Patient is taking Dexedrine.  Notes it is beneficial although she could benefit more.  She has been on a higher dose previously.  No sleep changes, appetite changes, or palpitations.  In the past she was also on an immediate release dose on the weekend given that she sleeps later and it would affect her sleep with the extended release.  Leukocytosis: She was evaluated by hematology. She notes no fevers or weight loss. She had evaluation that was reassuring though her WBC remained elevated. She was under the impression that she may have CLL. She is unsure if she needs to follow-up with hematology as advised.   Patient notes ear fullness and itchy ears. Couldn't hear out of her left ear, though now it is just muffled. No vertigo. Has chronic tinnitus.   Social History   Tobacco Use  Smoking Status Former Smoker  . Packs/day: 1.00  . Last attempt to quit: 11/24/2008  . Years since quitting: 9.1  Smokeless Tobacco Never Used     ROS see history of present illness  Objective  Physical Exam Vitals:   01/30/18 1043  BP: 128/80  Pulse: 78  Temp: 98.3 F (36.8 C)  SpO2: 98%    BP Readings from Last 3 Encounters:  01/30/18 128/80  12/15/17 135/83  12/01/17 128/88   Wt Readings from Last 3 Encounters:  01/30/18 216 lb (98 kg)  12/15/17 219 lb (99.3 kg)  12/01/17 220 lb (99.8 kg)    Physical Exam  Constitutional: No distress.  HENT:  Left TM with cerumen that appears to have resolved following irrigation by CMA, TM normal, right TM normal  Cardiovascular: Normal rate, regular rhythm and normal heart sounds.  Pulmonary/Chest: Effort normal and breath sounds normal.  Musculoskeletal: She exhibits no edema.  Neurological: She is alert. Gait normal.  Skin: Skin is warm and dry. She is not diaphoretic.     Assessment/Plan: Please see individual problem  list.  Leukocytosis Work up thus far has been unrevealing for cause. This will need to be monitored for now. We will message her hematologist to see if she can have monitoring through our office or if she needs to see them.   Attention deficit hyperactivity disorder (ADHD) Improved on meds though could improve more. We will increase dexedrine ER to 10 mg. Dexedrine IR 5 mg to take on the weekend. F/u in 3 months.   Cerumen impaction Irrigated with improvement of cerumen. Still with slight muffled sensation. She can trial OTC debrox. If not improving she will let us know.    No orders of the defined types were placed in this encounter.   Meds ordered this encounter  Medications  . dextroamphetamine (DEXEDRINE SPANSULE) 10 MG 24 hr capsule    Sig: Take 1 capsule (10 mg total) by mouth daily.    Dispense:  30 capsule    Refill:  0  . dextroamphetamine (DEXTROSTAT) 5 MG tablet    Sig: Take 1 tablet daily by mouth on Saturday and Sunday    Dispense:  12 tablet    Refill:  0     Kristin AlarEric Sonnenberg, MD [facility]

## 2018-02-02 NOTE — Assessment & Plan Note (Signed)
Improved on meds though could improve more. We will increase dexedrine ER to 10 mg. Dexedrine IR 5 mg to take on the weekend. F/u in 3 months.

## 2018-02-03 NOTE — Telephone Encounter (Signed)
Left detailed message to notify.

## 2018-02-27 ENCOUNTER — Encounter: Payer: Self-pay | Admitting: Family Medicine

## 2018-02-28 NOTE — Telephone Encounter (Signed)
Last OV 01/30/2018 last filled 03/16/17 60 0rf patient now uses [facility]

## 2018-03-03 NOTE — Telephone Encounter (Signed)
Spoke with patient she is ok with waiting until you get back to Monday to refill

## 2018-03-06 MED ORDER — ALPRAZOLAM 0.5 MG PO TABS: ORAL_TABLET | ORAL | 0 refills | Status: DC

## 2018-03-06 NOTE — Telephone Encounter (Signed)
She is one of yours

## 2018-03-21 ENCOUNTER — Ambulatory Visit: Payer: Managed Care, Other (non HMO) | Admitting: Oncology

## 2018-03-25 ENCOUNTER — Encounter: Payer: Self-pay | Admitting: Family Medicine

## 2018-03-30 ENCOUNTER — Encounter: Payer: Self-pay | Admitting: Family Medicine

## 2018-04-03 ENCOUNTER — Encounter: Payer: Self-pay | Admitting: Podiatry

## 2018-04-03 ENCOUNTER — Ambulatory Visit (INDEPENDENT_AMBULATORY_CARE_PROVIDER_SITE_OTHER): Payer: Managed Care, Other (non HMO) | Admitting: Family Medicine

## 2018-04-03 ENCOUNTER — Encounter: Payer: Self-pay | Admitting: Family Medicine

## 2018-04-03 DIAGNOSIS — F909 Attention-deficit hyperactivity disorder, unspecified type: Secondary | ICD-10-CM | POA: Diagnosis not present

## 2018-04-03 DIAGNOSIS — D72829 Elevated white blood cell count, unspecified: Secondary | ICD-10-CM | POA: Diagnosis not present

## 2018-04-03 DIAGNOSIS — F320 Major depressive disorder, single episode, mild: Secondary | ICD-10-CM | POA: Diagnosis not present

## 2018-04-03 DIAGNOSIS — E669 Obesity, unspecified: Secondary | ICD-10-CM

## 2018-04-03 DIAGNOSIS — R87619 Unspecified abnormal cytological findings in specimens from cervix uteri: Secondary | ICD-10-CM

## 2018-04-03 MED ORDER — TRIAMCINOLONE ACETONIDE 0.1 % EX CREA: TOPICAL_CREAM | CUTANEOUS | 0 refills | Status: AC

## 2018-04-03 MED ORDER — DEXTROAMPHETAMINE SULFATE ER 10 MG PO CP24: 10.0000 mg | ORAL_CAPSULE | Freq: Every day | ORAL | 0 refills | Status: DC

## 2018-04-03 MED ORDER — DEXTROAMPHETAMINE SULFATE ER 5 MG PO CP24: ORAL_CAPSULE | ORAL | 0 refills | Status: DC

## 2018-04-03 MED ORDER — DEXTROAMPHETAMINE SULFATE 5 MG PO TABS: ORAL_TABLET | ORAL | 0 refills | Status: DC

## 2018-04-03 NOTE — Patient Instructions (Signed)
Nice to see you. We will check your lab and contact you with the results. Please get set up with a physician in [place] when you move. Your ADHD regimen will be as follows:  Dexedrine 10 mg extended release by mouth daily Monday through Friday  Dexedrine 5 mg extended release by mouth on Saturday and Sunday if you wake up early  Dexedrine 5 mg immediate release by mouth on Saturday and Sunday if you wake up late

## 2018-04-03 NOTE — Progress Notes (Signed)
Kristin AlarEric Neli Fofana, MD Phone: [phone]  Kristin CooperKimberly Mccarthy is a 42 y.o. female who presents today for f/u.  CC: leukocytosis, ADHD  ADHD: Taking Dexedrine 10 mg extended release Monday through Friday and 5 mg extended release on Saturday and Sunday if she wakes up early and 5 mg immediate release if she wakes up late.  No sleep issues unless she takes the 5 mg on the weekend the late in the day.  No appetite suppression.  No palpitations.  Leukocytosis: No night sweats or weight loss.  She needs repeat testing.  Obesity: She has been much more active with trying to sell her house and moving.  She does not cook.  She eats lots of [place] food and hibachi.  She does feel her clothes are fitting better.  Depression: She notes this is quite a bit better with the increased dose of Zoloft.  She keeps moving and is much more motivated now.  No SI.  Social History   Tobacco Use  Smoking Status Former Smoker  . Packs/day: 1.00  . Last attempt to quit: 11/24/2008  . Years since quitting: 9.3  Smokeless Tobacco Never Used     ROS see history of present illness  Objective  Physical Exam Vitals:   04/03/18 1124  BP: 130/90  Pulse: 79  Temp: 98.6 F (37 C)  SpO2: 97%    BP Readings from Last 3 Encounters:  04/03/18 130/90  01/30/18 128/80  12/15/17 135/83   Wt Readings from Last 3 Encounters:  04/03/18 215 lb 3.2 oz (97.6 kg)  01/30/18 216 lb (98 kg)  12/15/17 219 lb (99.3 kg)    Physical Exam  Constitutional: No distress.  Cardiovascular: Normal rate, regular rhythm and normal heart sounds.  Pulmonary/Chest: Effort normal and breath sounds normal.  Musculoskeletal: She exhibits no edema.  Neurological: She is alert.  Skin: Skin is warm and dry. She is not diaphoretic.     Assessment/Plan: Please see individual problem list.  Attention deficit hyperactivity disorder (ADHD) She will continue Dexedrine.  She will take Dexedrine 10 mg extended release during the week  and Dexedrine 5 mg extended release on weekends if she wakes up early enough.  She will take Dexedrine 5 mg immediate release on weekends if she wakes up later in the day.  The patient is moving to [place] soon.  I advised that she would need to establish care there within the next 3 to 4 months and I am happy to refill this medication until then.  Leukocytosis Written order given for CBC with differential.  Abnormal cervical Papanicolaou smear She reports she saw gynecology and had a follow-up Pap smear that she states was normal.  Depression, major, single episode, mild (HCC) Improved.  She will stay on Zoloft.  Monitor for worsening.  Obesity (BMI 35.0-39.9 without comorbidity) Encouraged diet and exercise.   No orders of the defined types were placed in this encounter.   Meds ordered this encounter  Medications  . dextroamphetamine (DEXEDRINE SPANSULE) 10 MG 24 hr capsule    Sig: Take 1 capsule (10 mg total) by mouth daily.    Dispense:  30 capsule    Refill:  0  . dextroamphetamine (DEXTROSTAT) 5 MG tablet    Sig: Take 1 tablet daily by mouth on Saturday and Sunday if you wake up late    Dispense:  12 tablet    Refill:  0  . dextroamphetamine (DEXEDRINE SPANSULE) 5 MG 24 hr capsule    Sig: Take 1 tablet  by mouth on Saturday and Sunday if you wake up early    Dispense:  12 capsule    Refill:  0  . triamcinolone cream (KENALOG) 0.1 %    Sig: Applied topically once daily at night underneath breasts bilaterally    Dispense:  30 g    Refill:  0     Kristin Alar, MD [facility]

## 2018-04-04 NOTE — Assessment & Plan Note (Addendum)
Encouraged diet and exercise.  

## 2018-04-04 NOTE — Assessment & Plan Note (Addendum)
She reports she saw gynecology and had a follow-up Pap smear that she states was normal.

## 2018-04-04 NOTE — Assessment & Plan Note (Signed)
Written order given for CBC with differential.

## 2018-04-04 NOTE — Assessment & Plan Note (Signed)
She will continue Dexedrine.  She will take Dexedrine 10 mg extended release during the week and Dexedrine 5 mg extended release on weekends if she wakes up early enough.  She will take Dexedrine 5 mg immediate release on weekends if she wakes up later in the day.  The patient is moving to [place] soon.  I advised that she would need to establish care there within the next 3 to 4 months and I am happy to refill this medication until then.

## 2018-04-04 NOTE — Assessment & Plan Note (Signed)
Improved.  She will stay on Zoloft.  Monitor for worsening.

## 2018-04-10 ENCOUNTER — Other Ambulatory Visit: Payer: Self-pay | Admitting: Family Medicine

## 2018-04-11 LAB — CBC WITH DIFFERENTIAL/PLATELET
BASOS: 0 %
Basophils Absolute: 0 10*3/uL (ref 0.0–0.2)
EOS (ABSOLUTE): 0.2 10*3/uL (ref 0.0–0.4)
EOS: 2 %
HEMATOCRIT: 37.1 % (ref 34.0–46.6)
HEMOGLOBIN: 12.3 g/dL (ref 11.1–15.9)
Immature Grans (Abs): 0.1 10*3/uL (ref 0.0–0.1)
Immature Granulocytes: 1 %
LYMPHS ABS: 4.4 10*3/uL — AB (ref 0.7–3.1)
Lymphs: 34 %
MCH: 27.5 pg (ref 26.6–33.0)
MCHC: 33.2 g/dL (ref 31.5–35.7)
MCV: 83 fL (ref 79–97)
MONOS ABS: 0.6 10*3/uL (ref 0.1–0.9)
Monocytes: 5 %
Neutrophils Absolute: 7.5 10*3/uL — ABNORMAL HIGH (ref 1.4–7.0)
Neutrophils: 58 %
Platelets: 333 10*3/uL (ref 150–450)
RBC: 4.47 x10E6/uL (ref 3.77–5.28)
RDW: 14.6 % (ref 12.3–15.4)
WBC: 12.7 10*3/uL — ABNORMAL HIGH (ref 3.4–10.8)

## 2018-04-16 ENCOUNTER — Other Ambulatory Visit: Payer: Self-pay | Admitting: Family Medicine

## 2018-05-01 ENCOUNTER — Ambulatory Visit: Payer: Managed Care, Other (non HMO) | Admitting: Family Medicine

## 2018-05-17 ENCOUNTER — Encounter: Payer: Self-pay | Admitting: Family Medicine

## 2018-05-18 MED ORDER — DEXTROAMPHETAMINE SULFATE 5 MG PO TABS: ORAL_TABLET | ORAL | 0 refills | Status: DC

## 2018-05-18 MED ORDER — DEXTROAMPHETAMINE SULFATE ER 5 MG PO CP24: ORAL_CAPSULE | ORAL | 0 refills | Status: DC

## 2018-05-18 MED ORDER — DEXTROAMPHETAMINE SULFATE ER 10 MG PO CP24: 10.0000 mg | ORAL_CAPSULE | Freq: Every day | ORAL | 0 refills | Status: DC

## 2018-05-29 ENCOUNTER — Encounter: Payer: Self-pay | Admitting: Family Medicine

## 2018-07-07 ENCOUNTER — Encounter: Payer: Self-pay | Admitting: Family Medicine

## 2018-07-08 ENCOUNTER — Other Ambulatory Visit: Payer: Self-pay | Admitting: Internal Medicine

## 2018-07-08 DIAGNOSIS — F909 Attention-deficit hyperactivity disorder, unspecified type: Secondary | ICD-10-CM

## 2018-07-08 DIAGNOSIS — F419 Anxiety disorder, unspecified: Secondary | ICD-10-CM

## 2018-07-08 MED ORDER — DEXTROAMPHETAMINE SULFATE ER 10 MG PO CP24: 10.0000 mg | ORAL_CAPSULE | Freq: Every day | ORAL | 0 refills | Status: AC

## 2018-07-08 MED ORDER — ALPRAZOLAM 0.5 MG PO TABS: ORAL_TABLET | ORAL | 0 refills | Status: AC

## 2018-07-08 MED ORDER — DEXTROAMPHETAMINE SULFATE 5 MG PO TABS: ORAL_TABLET | ORAL | 0 refills | Status: AC

## 2018-07-08 MED ORDER — DEXTROAMPHETAMINE SULFATE ER 5 MG PO CP24: ORAL_CAPSULE | ORAL | 0 refills | Status: AC

## 2018-10-12 ENCOUNTER — Other Ambulatory Visit: Payer: Self-pay | Admitting: Family Medicine

## 2018-10-13 ENCOUNTER — Other Ambulatory Visit: Payer: Self-pay | Admitting: Family Medicine

## 2019-12-07 ENCOUNTER — Other Ambulatory Visit: Payer: Self-pay | Admitting: Family Medicine

## 2020-01-06 ENCOUNTER — Other Ambulatory Visit: Payer: Self-pay | Admitting: Family Medicine

## 2024-05-18 ENCOUNTER — Encounter: Payer: Self-pay | Admitting: Advanced Practice Midwife
